# Patient Record
Sex: Female | Born: 1935 | Race: White | Hispanic: No | Smoking: Never smoker
Health system: Southern US, Community
[De-identification: ages and names within clinical notes are randomized; demographics above are authoritative.]

## PROBLEM LIST (undated history)

## (undated) DIAGNOSIS — A0472 Enterocolitis due to Clostridium difficile, not specified as recurrent: Secondary | ICD-10-CM

## (undated) DIAGNOSIS — K625 Hemorrhage of anus and rectum: Secondary | ICD-10-CM

## (undated) DIAGNOSIS — K589 Irritable bowel syndrome without diarrhea: Secondary | ICD-10-CM

## (undated) DIAGNOSIS — E119 Type 2 diabetes mellitus without complications: Secondary | ICD-10-CM

## (undated) DIAGNOSIS — K5909 Other constipation: Secondary | ICD-10-CM

## (undated) DIAGNOSIS — R1032 Left lower quadrant pain: Secondary | ICD-10-CM

## (undated) DIAGNOSIS — K5792 Diverticulitis of intestine, part unspecified, without perforation or abscess without bleeding: Secondary | ICD-10-CM

## (undated) DIAGNOSIS — N289 Disorder of kidney and ureter, unspecified: Secondary | ICD-10-CM

## (undated) DIAGNOSIS — A498 Other bacterial infections of unspecified site: Secondary | ICD-10-CM

## (undated) DIAGNOSIS — K529 Noninfective gastroenteritis and colitis, unspecified: Secondary | ICD-10-CM

## (undated) DIAGNOSIS — I10 Essential (primary) hypertension: Secondary | ICD-10-CM

## (undated) HISTORY — PX: KIDNEY STONE SURGERY: SHX686

## (undated) HISTORY — DX: Diverticulitis of intestine, part unspecified, without perforation or abscess without bleeding: K57.92

## (undated) HISTORY — PX: OTHER SURGICAL HISTORY: SHX169

## (undated) HISTORY — DX: Other constipation: K59.09

## (undated) HISTORY — DX: Other bacterial infections of unspecified site: A49.8

## (undated) HISTORY — PX: BURN TREATMENT: SHX158

## (undated) HISTORY — DX: Irritable bowel syndrome, unspecified: K58.9

## (undated) HISTORY — DX: Type 2 diabetes mellitus without complications: E11.9

## (undated) HISTORY — DX: Essential (primary) hypertension: I10

## (undated) HISTORY — PX: BREAST BIOPSY: SHX20

## (undated) HISTORY — DX: Left lower quadrant pain: R10.32

## (undated) HISTORY — DX: Hemorrhage of anus and rectum: K62.5

## (undated) HISTORY — DX: Noninfective gastroenteritis and colitis, unspecified: K52.9

## (undated) HISTORY — PX: TONSILLECTOMY: SUR1361

## (undated) HISTORY — DX: Enterocolitis due to Clostridium difficile, not specified as recurrent: A04.72

---

## 1995-05-24 HISTORY — PX: SIGMOIDOSCOPY: SUR1295

## 1998-12-20 ENCOUNTER — Ambulatory Visit (HOSPITAL_BASED_OUTPATIENT_CLINIC_OR_DEPARTMENT_OTHER): Admission: RE | Admit: 1998-12-20 | Discharge: 1998-12-20 | Payer: Self-pay | Admitting: Orthopedic Surgery

## 2000-09-30 ENCOUNTER — Ambulatory Visit (HOSPITAL_COMMUNITY): Admission: RE | Admit: 2000-09-30 | Discharge: 2000-09-30 | Payer: Self-pay | Admitting: Obstetrics and Gynecology

## 2000-09-30 ENCOUNTER — Encounter: Payer: Self-pay | Admitting: Obstetrics and Gynecology

## 2000-12-10 ENCOUNTER — Ambulatory Visit (HOSPITAL_COMMUNITY): Admission: RE | Admit: 2000-12-10 | Discharge: 2000-12-10 | Payer: Self-pay | Admitting: Pulmonary Disease

## 2001-08-03 ENCOUNTER — Other Ambulatory Visit: Admission: RE | Admit: 2001-08-03 | Discharge: 2001-08-03 | Payer: Self-pay | Admitting: Obstetrics and Gynecology

## 2002-12-15 ENCOUNTER — Ambulatory Visit (HOSPITAL_COMMUNITY): Admission: RE | Admit: 2002-12-15 | Discharge: 2002-12-15 | Payer: Self-pay | Admitting: Obstetrics and Gynecology

## 2002-12-15 ENCOUNTER — Encounter: Payer: Self-pay | Admitting: Obstetrics and Gynecology

## 2003-01-25 ENCOUNTER — Other Ambulatory Visit: Admission: RE | Admit: 2003-01-25 | Discharge: 2003-01-25 | Payer: Self-pay | Admitting: Dermatology

## 2004-02-01 ENCOUNTER — Ambulatory Visit (HOSPITAL_COMMUNITY): Admission: RE | Admit: 2004-02-01 | Discharge: 2004-02-01 | Payer: Self-pay | Admitting: Obstetrics and Gynecology

## 2004-05-14 ENCOUNTER — Ambulatory Visit: Payer: Self-pay | Admitting: Family Medicine

## 2004-11-11 ENCOUNTER — Ambulatory Visit: Payer: Self-pay | Admitting: Family Medicine

## 2004-12-17 ENCOUNTER — Ambulatory Visit: Payer: Self-pay | Admitting: Family Medicine

## 2005-02-04 ENCOUNTER — Ambulatory Visit: Payer: Self-pay | Admitting: Family Medicine

## 2005-03-03 ENCOUNTER — Ambulatory Visit (HOSPITAL_COMMUNITY): Admission: RE | Admit: 2005-03-03 | Discharge: 2005-03-03 | Payer: Self-pay | Admitting: Obstetrics and Gynecology

## 2005-03-16 ENCOUNTER — Ambulatory Visit: Payer: Self-pay | Admitting: Family Medicine

## 2005-04-02 ENCOUNTER — Ambulatory Visit: Payer: Self-pay | Admitting: Family Medicine

## 2005-05-11 ENCOUNTER — Ambulatory Visit: Payer: Self-pay | Admitting: Family Medicine

## 2005-08-13 ENCOUNTER — Encounter (INDEPENDENT_AMBULATORY_CARE_PROVIDER_SITE_OTHER): Payer: Self-pay | Admitting: Specialist

## 2005-08-13 ENCOUNTER — Ambulatory Visit (HOSPITAL_BASED_OUTPATIENT_CLINIC_OR_DEPARTMENT_OTHER): Admission: RE | Admit: 2005-08-13 | Discharge: 2005-08-13 | Payer: Self-pay | Admitting: Orthopedic Surgery

## 2005-08-18 ENCOUNTER — Ambulatory Visit: Payer: Self-pay | Admitting: Family Medicine

## 2005-09-07 ENCOUNTER — Ambulatory Visit: Payer: Self-pay | Admitting: Family Medicine

## 2005-10-27 ENCOUNTER — Ambulatory Visit: Payer: Self-pay | Admitting: Family Medicine

## 2005-11-30 ENCOUNTER — Ambulatory Visit: Payer: Self-pay | Admitting: Family Medicine

## 2006-03-09 ENCOUNTER — Ambulatory Visit: Payer: Self-pay | Admitting: Family Medicine

## 2006-04-06 ENCOUNTER — Ambulatory Visit (HOSPITAL_COMMUNITY): Admission: RE | Admit: 2006-04-06 | Discharge: 2006-04-06 | Payer: Self-pay | Admitting: Obstetrics and Gynecology

## 2006-04-16 ENCOUNTER — Ambulatory Visit: Payer: Self-pay | Admitting: Family Medicine

## 2006-06-23 ENCOUNTER — Ambulatory Visit: Payer: Self-pay | Admitting: Family Medicine

## 2007-04-11 ENCOUNTER — Ambulatory Visit (HOSPITAL_COMMUNITY): Admission: RE | Admit: 2007-04-11 | Discharge: 2007-04-11 | Payer: Self-pay | Admitting: Obstetrics and Gynecology

## 2007-10-20 ENCOUNTER — Ambulatory Visit (HOSPITAL_COMMUNITY): Admission: RE | Admit: 2007-10-20 | Discharge: 2007-10-20 | Payer: Self-pay | Admitting: Emergency Medicine

## 2007-10-21 ENCOUNTER — Ambulatory Visit (HOSPITAL_COMMUNITY): Admission: RE | Admit: 2007-10-21 | Discharge: 2007-10-21 | Payer: Self-pay | Admitting: Obstetrics & Gynecology

## 2007-10-27 ENCOUNTER — Ambulatory Visit (HOSPITAL_COMMUNITY): Admission: RE | Admit: 2007-10-27 | Discharge: 2007-10-27 | Payer: Self-pay | Admitting: Urology

## 2007-12-06 HISTORY — PX: COLONOSCOPY: SHX174

## 2008-04-25 ENCOUNTER — Ambulatory Visit (HOSPITAL_COMMUNITY): Admission: RE | Admit: 2008-04-25 | Discharge: 2008-04-25 | Payer: Self-pay | Admitting: Obstetrics and Gynecology

## 2009-05-01 ENCOUNTER — Ambulatory Visit (HOSPITAL_COMMUNITY): Admission: RE | Admit: 2009-05-01 | Discharge: 2009-05-01 | Payer: Self-pay | Admitting: Obstetrics and Gynecology

## 2010-05-05 ENCOUNTER — Ambulatory Visit (HOSPITAL_COMMUNITY): Admission: RE | Admit: 2010-05-05 | Discharge: 2010-05-05 | Payer: Self-pay | Admitting: Family Medicine

## 2010-06-17 ENCOUNTER — Ambulatory Visit: Payer: Self-pay | Admitting: Internal Medicine

## 2010-07-18 ENCOUNTER — Emergency Department (HOSPITAL_COMMUNITY)
Admission: EM | Admit: 2010-07-18 | Discharge: 2010-07-18 | Payer: Self-pay | Source: Home / Self Care | Admitting: Emergency Medicine

## 2010-07-18 LAB — CBC
HCT: 39.6 % (ref 36.0–46.0)
MCHC: 34.3 g/dL (ref 30.0–36.0)
RBC: 4.57 MIL/uL (ref 3.87–5.11)
RDW: 14.4 % (ref 11.5–15.5)
WBC: 7.4 10*3/uL (ref 4.0–10.5)

## 2010-07-18 LAB — DIFFERENTIAL
Basophils Relative: 1 % (ref 0–1)
Eosinophils Relative: 2 % (ref 0–5)
Lymphs Abs: 1.3 10*3/uL (ref 0.7–4.0)
Monocytes Absolute: 0.6 10*3/uL (ref 0.1–1.0)
Neutro Abs: 5.2 10*3/uL (ref 1.7–7.7)

## 2010-07-18 LAB — BASIC METABOLIC PANEL
BUN: 17 mg/dL (ref 6–23)
Calcium: 9.9 mg/dL (ref 8.4–10.5)
GFR calc Af Amer: >60 mL/min (ref >60)
GFR calc non Af Amer: 55 mL/min — ABNORMAL LOW (ref >60)
Glucose, Bld: 137 mg/dL — ABNORMAL HIGH (ref 70–99)
Potassium: 3.9 mEq/L (ref 3.5–5.1)

## 2010-07-24 ENCOUNTER — Institutional Professional Consult (permissible substitution) (INDEPENDENT_AMBULATORY_CARE_PROVIDER_SITE_OTHER): Payer: MEDICARE | Admitting: Internal Medicine

## 2010-07-24 ENCOUNTER — Ambulatory Visit: Admit: 2010-07-24 | Payer: Self-pay | Admitting: Internal Medicine

## 2010-07-24 DIAGNOSIS — K5732 Diverticulitis of large intestine without perforation or abscess without bleeding: Secondary | ICD-10-CM

## 2010-09-03 LAB — HEPATIC FUNCTION PANEL: ALT: 13 U/L (ref 7–35)

## 2010-09-03 LAB — BASIC METABOLIC PANEL: Glucose: 170 mg/dL

## 2010-09-03 LAB — CBC AND DIFFERENTIAL: HCT: 39 % (ref 36–46)

## 2010-09-23 ENCOUNTER — Ambulatory Visit (INDEPENDENT_AMBULATORY_CARE_PROVIDER_SITE_OTHER): Payer: MEDICARE | Admitting: Internal Medicine

## 2010-09-23 DIAGNOSIS — Z8719 Personal history of other diseases of the digestive system: Secondary | ICD-10-CM

## 2010-09-23 DIAGNOSIS — K589 Irritable bowel syndrome without diarrhea: Secondary | ICD-10-CM

## 2010-10-06 NOTE — Consult Note (Signed)
NAME:  CROWDERLakitha, Monica Murray             ACCOUNT NO.:  [id number]  MEDICAL RECORD NO.:  [id number]           PATIENT TYPE: AMB.  LOCATION:  [place].                  [facility]  PHYSICIAN:  Monica Murray, M.D.    DATE OF BIRTH:  1935/12/05  DATE OF CONSULTATION:  09/23/2010 DATE OF DISCHARGE:                                OFFICE VISIT.   PRESENTING COMPLAINT:  Followup for recurrent diverticulitis.  The patient complains of irregular bowel movements and urgency.  SUBJECTIVE:  Monica Murray is a 75 year old Caucasian female patient of Dr. Joyce Murray who is well-known to [place].  Her last colonoscopy was in June 2009 which showed scattered diverticula at the sigmoid colon.  She had a small tubular adenoma removed at that time.  In November 2011, she developed sharp severe pain in left lower quadrant of her abdomen.  She was seen by Dr. Lysbeth Murray and treated with Cipro and Flagyl for diverticulitis.  She believes she had a full recovery.  She had recurrence of her pain in January and she was seen at [facility] Emergency Room on July 18, 2010.  She also noticed some blood with a bowel movements.  She had CT which showed changes of sigmoid diverticulitis.  She was retreated with antibiotics and felt better.  At this time, she took Augmentin.  When she came to our office on July 24, 2010, she was on Augmentin.  Few days after stopping Augmentin, her pain relapsed.  She called our office on August 05, 2010.  She was called in Cipro and Flagyl for 10 days.  She states since taking the last course of antibiotic, she has not had any sharp pain.  She had some soreness in left lower quadrant of her abdomen.  She is having diarrhea and/or constipation.  She also has postprandial bowel movement and urgency.  Lately, she has had 4 bowel movements every day and all of these occur before lunch and at times, she will have another one or two later in the day.  Most of her stools are semi-formed.   She has not seen any blood in the last 2 weeks.  She says her appetite is very good and her weight has been stable.  She recalls that her father had a colostomy in his 73s for diverticulitis and subsequently he had it taken down. She wants to make sure that she does not do anything that will cause her to have another episode of diverticulitis.  CURRENT MEDICATIONS: 1. Enalapril/HCT 10/25 daily. 2. ASA 81 mg daily. 3. Fish oil 1 g p.o. daily.  She does not take any OTC and NSAIDs.  OBJECTIVE:  VITAL SIGNS:  Weight 203 pounds, she is 65 inches tall, pulse 70 per minute, blood pressure 110/68 and temp is 97.6. EYES:  Conjunctivae are pink.  Sclerae are nonicteric. MOUTH:  Oropharyngeal mucosa is normal. NECK:  No neck masses or thyromegaly noted. ABDOMEN:  Symmetrical.  Bowel sounds are normal.  On palpation, soft abdomen with mild tenderness at LLQ.  She has incisional hernia and left flank from prior surgery on her left kidney.  No organomegaly or masses noted.  LABORATORY DATA:  From Dr. Joyce Murray office from  September 03, 2010, WBC is 7.4, H and H is 12.7 and 39.2, platelet count 251 K.  Comprehensive chemistry panel is normal exception of glucose of 170, creatinine of 1.24.  Her calcium was 9.4.  TSH was 3.905, sed rate was 36 which is mildly elevated.  ASSESSMENT:  It appears Monica Murray has fully recovered from diverticulitis. She had to be treated with antibiotics on four different occasions.  Her third and fourth episode may just be one extended bout.  Her current symptoms are suggestive of irritable bowel syndrome.  Her CBC is normal. Sed rate is mildly elevated which would be nonspecific, but maybe repeated at some point in the future.  She is up-to-date on her colonoscopies.  She needs to be on high-fiber diet and fiber supplement and she also would benefit from low-dose antispasmodic.  If she has another episode of unequivocal diverticulitis one would have to consider surgery,  but hopefully this would not be necessary.  PLAN:  High-fiber diet plus fiber supplement 3-4 g daily.  Since she has had problems with Metamucil, she can try Benefiber or FiberChoice.  Dicyclomine 10 mg before breakfast.  She may also eat yogurt Activia daily.  Unless she has problems, she will return for OV in 4 months.     Monica Murray, M.D.     NR/MEDQ  D:  09/23/2010  T:  09/24/2010  Job:  [id number]  cc:   Monica Murray, M.D. Fax: [phone]  Electronically Signed by Monica Murray M.D. on 10/06/2010 12:05:00 PM

## 2010-11-07 NOTE — Op Note (Signed)
NAME:  Monica Murray, Monica Murray             ACCOUNT NO.:  [id number]   MEDICAL RECORD NO.:  [id number]          PATIENT TYPE:  AMB   LOCATION:  [facility]                          FACILITY:  [facility]   PHYSICIAN:  Cindee Salt, M.D.       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   OPERATION:  Removal of three cysts, release of A1 pulley right middle  fingers.   SURGEON:  Cindee Salt, M.D.   ASSISTANT:  Carolyne Fiscal R.N.   ANESTHESIA:  General.   HISTORY:  The patient is a 75 year old female with a history of triggering  of her right middle finger. She has developed two cysts on the palmar aspect  of the A1 pulley. She is admitted now for excision and release of the A1  pulley.   PROCEDURE:  The patient is brought to the operating room where a general  anesthetic was carried out without difficulty. She was prepped using  DuraPrep, supine position, right arm free. The limb was exsanguinated with  an Esmarch bandage, tourniquet placed on the forearm was inflated to 250  mmHg, oblique incision was made, carried down through subcutaneous tissue.  This was over the A1 pulley of the right middle finger. Neurovascular  bundles were identified and protected. Retractor was placed.  A large cystic  structure was present over the ulnar aspect of the A1 pulley.  Two other  cysts on the radial side were also noted separated from the other cyst. The  A1 pulley was then release.  The three cysts were excised, sent to  pathology. A small incision was made in the central aspect of the A2 pulley.  The release was performed on the radial aspect of the A1 pulley. The finger  placed through full range motion, no further triggering was  identified.  Palmar pulley was present. This was also released protecting the  neurovascular bundles. The wound was irrigated. Skin closed interrupted 5-0  nylon sutures. Sterile compressive dressing was applied. The patient  tolerated the procedure well was taken to the recovery for observation in  satisfactory condition. She is discharged home to return to the [facility]  of [place] in one week on Vicodin.           ______________________________  Cindee Salt, M.D.     GK/MEDQ  D:  08/13/2005  T:  08/14/2005  Job:  [id number]

## 2011-01-13 ENCOUNTER — Other Ambulatory Visit (INDEPENDENT_AMBULATORY_CARE_PROVIDER_SITE_OTHER): Payer: Self-pay | Admitting: *Deleted

## 2011-01-14 MED ORDER — DICYCLOMINE HCL 10 MG PO CAPS: 10.0000 mg | ORAL_CAPSULE | ORAL | Status: DC

## 2011-01-15 NOTE — Telephone Encounter (Signed)
RX approved.

## 2011-01-26 ENCOUNTER — Encounter (INDEPENDENT_AMBULATORY_CARE_PROVIDER_SITE_OTHER): Payer: Self-pay | Admitting: *Deleted

## 2011-02-05 ENCOUNTER — Encounter (INDEPENDENT_AMBULATORY_CARE_PROVIDER_SITE_OTHER): Payer: Self-pay

## 2011-03-31 ENCOUNTER — Encounter (INDEPENDENT_AMBULATORY_CARE_PROVIDER_SITE_OTHER): Payer: Self-pay | Admitting: Internal Medicine

## 2011-03-31 ENCOUNTER — Ambulatory Visit (INDEPENDENT_AMBULATORY_CARE_PROVIDER_SITE_OTHER): Payer: Medicare Other | Admitting: Internal Medicine

## 2011-03-31 VITALS — BP 130/70 | HR 74 | Temp 97.2°F | Resp 16 | Ht 64.0 in | Wt 209.0 lb

## 2011-03-31 DIAGNOSIS — K589 Irritable bowel syndrome without diarrhea: Secondary | ICD-10-CM

## 2011-03-31 DIAGNOSIS — R109 Unspecified abdominal pain: Secondary | ICD-10-CM

## 2011-03-31 NOTE — Patient Instructions (Addendum)
Increase dicyclomine 20 mg by mouth daily 30 minutes before breakfast. Continue high fiber diet.

## 2011-04-01 ENCOUNTER — Other Ambulatory Visit (HOSPITAL_COMMUNITY): Payer: Self-pay | Admitting: Family Medicine

## 2011-04-01 DIAGNOSIS — Z139 Encounter for screening, unspecified: Secondary | ICD-10-CM

## 2011-04-01 NOTE — Progress Notes (Signed)
Presenting complaint; followup for bowel problems. Subjective; Ghada a 75 year old Caucasian female patient of Dr. Lysbeth Galas for scheduled visit. During the fall of last year and early this year she was treated for recurrent bouts of diverticulitis. She was last seen on 09/23/2010 and felt to have irritable bowel syndrome. She was advice fiber supplement 3-4 g daily and dicyclomine 10 mg every morning and yogurt daily. She states she is not having any abdominal pain but her bowels are still not regular. Every morning she has 3 bowel movements in each one of them is small volume. Stool is generally formed. She denies melena or rectal bleeding. She has not been constipated since her last visit. Her appetite is good. She has gained 6 pounds since her last visit. She reports no side effects or dicyclomine. Current medications Current Outpatient Prescriptions  Medication Sig Dispense Refill  . aspirin 81 MG tablet Take 81 mg by mouth daily.        Marland Kitchen dicyclomine (BENTYL) 10 MG capsule Take 1 capsule (10 mg total) by mouth every morning.  30 capsule  1  . enalapril-hydrochlorothiazide (VASERETIC) 10-25 MG per tablet Take 1 tablet by mouth daily. As Needed      . fish oil-omega-3 fatty acids 1000 MG capsule Take by mouth daily.        . IBUPROFEN PO Take 200 mg by mouth. As Needed         Objective BP 130/70  Pulse 74  Temp(Src) 97.2 F (36.2 C) (Oral)  Resp 16  Ht 5\' 4"  (1.626 m)  Wt 209 lb (94.802 kg)  BMI 35.87 kg/m2 Conjunctiva is pink. Sclerae nonicteric Oropharyngeal mucosa is normal. No neck masses or thyromegaly noted. Her abdomen is full. Bowel sounds are hyperactive. Abdomen is soft and nontender without organomegaly or masses. No peripheral edema noted. Assessment Patient's symptoms are felt to be typical of irritable bowel syndrome. She may benefit from high dose of dicyclomine. She now has gone about 8 or 9 months without diverticulitis which is very reassuring. Plan Continue  high-fiber diet. Increase dicyclomine to 20 mg daily before breakfast. If this therapy does not work she'll give [place] a call otherwise return for office visit in one year.

## 2011-04-17 ENCOUNTER — Other Ambulatory Visit (INDEPENDENT_AMBULATORY_CARE_PROVIDER_SITE_OTHER): Payer: Self-pay | Admitting: Internal Medicine

## 2011-04-17 DIAGNOSIS — K5792 Diverticulitis of intestine, part unspecified, without perforation or abscess without bleeding: Secondary | ICD-10-CM

## 2011-05-07 ENCOUNTER — Ambulatory Visit (HOSPITAL_COMMUNITY)
Admission: RE | Admit: 2011-05-07 | Discharge: 2011-05-07 | Disposition: A | Discharge status: Home / Self Care | Payer: 59 | Source: Ambulatory Visit | Attending: Family Medicine | Admitting: Family Medicine

## 2011-05-07 DIAGNOSIS — Z1231 Encounter for screening mammogram for malignant neoplasm of breast: Secondary | ICD-10-CM | POA: Insufficient documentation

## 2011-05-07 DIAGNOSIS — Z139 Encounter for screening, unspecified: Secondary | ICD-10-CM

## 2011-06-05 ENCOUNTER — Other Ambulatory Visit: Payer: Self-pay | Admitting: Family Medicine

## 2011-06-05 DIAGNOSIS — R928 Other abnormal and inconclusive findings on diagnostic imaging of breast: Secondary | ICD-10-CM

## 2011-06-17 ENCOUNTER — Ambulatory Visit (HOSPITAL_COMMUNITY)
Admission: RE | Admit: 2011-06-17 | Discharge: 2011-06-17 | Disposition: A | Discharge status: Home / Self Care | Payer: 59 | Source: Ambulatory Visit | Attending: Family Medicine | Admitting: Family Medicine

## 2011-06-17 DIAGNOSIS — R928 Other abnormal and inconclusive findings on diagnostic imaging of breast: Secondary | ICD-10-CM

## 2012-03-30 ENCOUNTER — Encounter (INDEPENDENT_AMBULATORY_CARE_PROVIDER_SITE_OTHER): Payer: Self-pay | Admitting: *Deleted

## 2012-04-13 ENCOUNTER — Ambulatory Visit (INDEPENDENT_AMBULATORY_CARE_PROVIDER_SITE_OTHER): Payer: Medicare Other | Admitting: Internal Medicine

## 2012-04-13 ENCOUNTER — Encounter (INDEPENDENT_AMBULATORY_CARE_PROVIDER_SITE_OTHER): Payer: Self-pay | Admitting: Internal Medicine

## 2012-04-13 VITALS — BP 116/68 | HR 64 | Temp 97.8°F | Ht 64.0 in | Wt 205.2 lb

## 2012-04-13 DIAGNOSIS — K589 Irritable bowel syndrome without diarrhea: Secondary | ICD-10-CM

## 2012-04-13 MED ORDER — DICYCLOMINE HCL 10 MG PO CAPS: 10.0000 mg | ORAL_CAPSULE | Freq: Three times a day (TID) | ORAL | Status: DC

## 2012-04-13 NOTE — Progress Notes (Signed)
Subjective:     Patient ID: Monica Murray, female   DOB: Jul 04, 1935, 76 y.o.   MRN: [id number]  HPIHere today for a scheduled visit. She tells me she is having 4 loose stools a day. She thinks she may see mucous. She says her rectum is tender and has been using clobetasol propionate.  After she has diarrhea she will have rectal burning.   Diarrhea about once a week.  She has been incontinent. Appetite is good. NO weight loss.  No melena or bright red rectal bleeding.  No recent antibiotics. No recent bout of diverticulitis      03/31/2011 Subjective; Monica Murray a 76 year old Caucasian female patient of Dr. Lysbeth Galas for scheduled visit. During the fall of last year and early this year she was treated for recurrent bouts of diverticulitis. She was last seen on 09/23/2010 and felt to have irritable bowel syndrome. She was advice fiber supplement 3-4 g daily and dicyclomine 10 mg every morning and yogurt daily.  She states she is not having any abdominal pain but her bowels are still not regular. Every morning she has 3 bowel movements in each one of them is small volume. Stool is generally formed. She denies melena or rectal bleeding. She has not been constipated since her last visit. Her appetite is good. She has gained 6 pounds since her last visit. She reports no side effects or dicyclomine.  Review of Systems  Current Outpatient Prescriptions  Medication Sig Dispense Refill  . aspirin 81 MG tablet Take 81 mg by mouth daily.        . enalapril-hydrochlorothiazide (VASERETIC) 10-25 MG per tablet Take 1 tablet by mouth daily. As Needed      . escitalopram (LEXAPRO) 10 MG tablet Take 20 mg by mouth daily.      . fish oil-omega-3 fatty acids 1000 MG capsule Take by mouth daily.        . Red Yeast Rice Extract (RED YEAST RICE PO) Take by mouth. 4 tabs a day      . DISCONTD: dicyclomine (BENTYL) 10 MG capsule Take 10 mg by mouth 2 (two) times daily before a meal.      . dicyclomine (BENTYL) 10 MG  capsule Take 1 capsule (10 mg total) by mouth 3 (three) times daily.  90 capsule  4  . DISCONTD: dicyclomine (BENTYL) 10 MG capsule Take 1 capsule (10 mg total) by mouth every morning.  30 capsule  1   Past Medical History  Diagnosis Date  . Diverticulitis   . LLQ pain   . Chronic constipation   . Chronic diarrhea   . Irritable bowel syndrome   . Rectal bleed    Past Surgical History  Procedure Date  . Colonoscopy 12/06/07    NUR  . Sigmoidoscopy 05/24/95    NUR   History   Social History  . Marital Status: Married    Spouse Name: N/A    Number of Children: N/A  . Years of Education: N/A   Occupational History  . Not on file.   Social History Main Topics  . Smoking status: Never Smoker   . Smokeless tobacco: Never Used  . Alcohol Use: No  . Drug Use: No  . Sexually Active: Not on file   Other Topics Concern  . Not on file   Social History Narrative  . No narrative on file   Family Status  Relation Status Death Age  . Mother Deceased COPD    Passed away in 12/03/1999  .  Father Deceased 76    Died 13  . Sister Alive   . Son Alive     Cancer on Penis removed 2 years ago   Allergies  Allergen Reactions  . Macrodantin   . Sulfa Antibiotics        Objective:   Physical Exam Filed Vitals:   04/13/12 1037  BP: 116/68  Pulse: 64  Temp: 97.8 F (36.6 C)  Height: 5\' 4"  (1.626 m)  Weight: 205 lb 3.2 oz (93.078 kg)   Alert and oriented. Skin warm and dry. Oral mucosa is moist.   . Sclera anicteric, conjunctivae is pink. Thyroid not enlarged. No cervical lymphadenopathy. Lungs clear. Heart regular rate and rhythm.  Abdomen is soft. Bowel sounds are positive. No hepatomegaly. No abdominal masses felt. No tenderness.  No edema to lower extremities. Patient is alert and oriented.     Assessment:   Probable IBS diarrhea predominant.  She has hx of same. No recent antibiotics. She has had some fecal incontinence. Will get stool studies to be sure we are not dealing  with an infectious process.    Plan:    Fiber tabs.  Imodium BID on days you have diarrhea. Stool for c-diff, o and P, culture, WBC. Dicyclomine 10mg  TID.  OV in 1 yr

## 2012-04-13 NOTE — Patient Instructions (Signed)
Fiber tabs 4 gm. Imodium BID. Stool studies. Increase Dicyclomine to TID

## 2012-04-13 NOTE — Addendum Note (Signed)
Addended by: Len Blalock on: 04/13/2012 11:05 AM   Modules accepted: Orders

## 2012-04-15 LAB — FECAL LACTOFERRIN, QUANT: Lactoferrin: POSITIVE

## 2012-04-15 LAB — OVA AND PARASITE SCREEN: OP: NONE SEEN

## 2012-04-18 LAB — STOOL CULTURE

## 2012-05-09 ENCOUNTER — Encounter (INDEPENDENT_AMBULATORY_CARE_PROVIDER_SITE_OTHER): Payer: Self-pay

## 2012-05-31 ENCOUNTER — Other Ambulatory Visit: Payer: Self-pay | Admitting: Obstetrics & Gynecology

## 2012-05-31 DIAGNOSIS — Z139 Encounter for screening, unspecified: Secondary | ICD-10-CM

## 2012-06-09 ENCOUNTER — Ambulatory Visit (HOSPITAL_COMMUNITY): Payer: Medicare Other

## 2012-06-20 ENCOUNTER — Ambulatory Visit (HOSPITAL_COMMUNITY)
Admission: RE | Admit: 2012-06-20 | Discharge: 2012-06-20 | Disposition: A | Discharge status: Home / Self Care | Payer: 59 | Source: Ambulatory Visit | Attending: Obstetrics & Gynecology | Admitting: Obstetrics & Gynecology

## 2012-06-20 DIAGNOSIS — Z1231 Encounter for screening mammogram for malignant neoplasm of breast: Secondary | ICD-10-CM | POA: Insufficient documentation

## 2012-06-20 DIAGNOSIS — Z139 Encounter for screening, unspecified: Secondary | ICD-10-CM

## 2012-09-19 DIAGNOSIS — T3121 Burns involving 20-29% of body surface with 10-19% third degree burns: Secondary | ICD-10-CM | POA: Insufficient documentation

## 2013-04-04 ENCOUNTER — Encounter (INDEPENDENT_AMBULATORY_CARE_PROVIDER_SITE_OTHER): Payer: Self-pay | Admitting: *Deleted

## 2013-05-29 ENCOUNTER — Ambulatory Visit (INDEPENDENT_AMBULATORY_CARE_PROVIDER_SITE_OTHER): Payer: 59 | Admitting: Internal Medicine

## 2013-06-27 ENCOUNTER — Other Ambulatory Visit: Payer: Self-pay | Admitting: Obstetrics & Gynecology

## 2013-06-27 DIAGNOSIS — Z139 Encounter for screening, unspecified: Secondary | ICD-10-CM

## 2013-07-10 ENCOUNTER — Ambulatory Visit (HOSPITAL_COMMUNITY): Payer: 59

## 2013-07-11 ENCOUNTER — Ambulatory Visit: Payer: Self-pay | Admitting: Obstetrics & Gynecology

## 2013-08-17 ENCOUNTER — Ambulatory Visit (HOSPITAL_COMMUNITY): Payer: 59

## 2013-08-17 ENCOUNTER — Other Ambulatory Visit: Payer: Self-pay | Admitting: Obstetrics & Gynecology

## 2013-08-22 ENCOUNTER — Ambulatory Visit (HOSPITAL_COMMUNITY)
Admission: RE | Admit: 2013-08-22 | Discharge: 2013-08-22 | Disposition: A | Discharge status: Home / Self Care | Payer: Medicare Other | Source: Ambulatory Visit | Attending: Obstetrics & Gynecology | Admitting: Obstetrics & Gynecology

## 2013-08-22 DIAGNOSIS — Z139 Encounter for screening, unspecified: Secondary | ICD-10-CM

## 2013-08-22 DIAGNOSIS — Z1231 Encounter for screening mammogram for malignant neoplasm of breast: Secondary | ICD-10-CM | POA: Insufficient documentation

## 2013-08-29 ENCOUNTER — Encounter (INDEPENDENT_AMBULATORY_CARE_PROVIDER_SITE_OTHER): Payer: Self-pay

## 2013-08-29 ENCOUNTER — Ambulatory Visit (INDEPENDENT_AMBULATORY_CARE_PROVIDER_SITE_OTHER): Payer: Medicare Other | Admitting: Obstetrics & Gynecology

## 2013-08-29 ENCOUNTER — Encounter: Payer: Self-pay | Admitting: Obstetrics & Gynecology

## 2013-08-29 VITALS — BP 140/80 | Ht 63.0 in | Wt 208.0 lb

## 2013-08-29 DIAGNOSIS — Z01419 Encounter for gynecological examination (general) (routine) without abnormal findings: Secondary | ICD-10-CM

## 2013-08-29 DIAGNOSIS — N3941 Urge incontinence: Secondary | ICD-10-CM | POA: Insufficient documentation

## 2013-08-29 MED ORDER — MIRABEGRON ER 50 MG PO TB24: ORAL_TABLET | ORAL | Status: DC

## 2013-08-29 NOTE — Progress Notes (Signed)
Patient ID: Monica Murray, female   DOB: [birth year], 78 y.o.   MRN: [id number] Subjective:     GLENNYS Murray is a 78 y.o. female here for a routine exam.  No LMP recorded. Patient has had a hysterectomy. No obstetric history on file. Birth Control Method:  na Menstrual Calendar(currently): na  Current complaints: none.   Current acute medical issues:  na   Recent Gynecologic History No LMP recorded. Patient has had a hysterectomy. Last Pap: na,   Last mammogram: 2015,  normal  Past Medical History  Diagnosis Date  . Diverticulitis   . LLQ pain   . Chronic constipation   . Chronic diarrhea   . Irritable bowel syndrome   . Rectal bleed     Past Surgical History  Procedure Laterality Date  . Colonoscopy  12/06/07    NUR  . Sigmoidoscopy  05/24/95    NUR    OB History   Grav Para Term Preterm Abortions TAB SAB Ect Mult Living                  History   Social History  . Marital Status: Married    Spouse Name: N/A    Number of Children: N/A  . Years of Education: N/A   Social History Main Topics  . Smoking status: Never Smoker   . Smokeless tobacco: Never Used  . Alcohol Use: No  . Drug Use: No  . Sexual Activity: None   Other Topics Concern  . None   Social History Narrative  . None    Family History  Problem Relation Age of Onset  . Breast cancer Sister      Review of Systems  Review of Systems  Constitutional: Negative for fever, chills, weight loss, malaise/fatigue and diaphoresis.  HENT: Negative for hearing loss, ear pain, nosebleeds, congestion, sore throat, neck pain, tinnitus and ear discharge.   Eyes: Negative for blurred vision, double vision, photophobia, pain, discharge and redness.  Respiratory: Negative for cough, hemoptysis, sputum production, shortness of breath, wheezing and stridor.   Cardiovascular: Negative for chest pain, palpitations, orthopnea, claudication, leg swelling and PND.  Gastrointestinal: negative for  abdominal pain. Negative for heartburn, nausea, vomiting, diarrhea, constipation, blood in stool and melena.  Genitourinary: Negative for dysuria, urgency, frequency, hematuria and flank pain.  Musculoskeletal: Negative for myalgias, back pain, joint pain and falls.  Skin: Negative for itching and rash.  Neurological: Negative for dizziness, tingling, tremors, sensory change, speech change, focal weakness, seizures, loss of consciousness, weakness and headaches.  Endo/Heme/Allergies: Negative for environmental allergies and polydipsia. Does not bruise/bleed easily.  Psychiatric/Behavioral: Negative for depression, suicidal ideas, hallucinations, memory loss and substance abuse. The patient is not nervous/anxious and does not have insomnia.        Objective:    Physical Exam  Vitals reviewed. Constitutional: She is oriented to person, place, and time. She appears well-developed and well-nourished.  HENT:  Head: Normocephalic and atraumatic.        Right Ear: External ear normal.  Left Ear: External ear normal.  Nose: Nose normal.  Mouth/Throat: Oropharynx is clear and moist.  Eyes: Conjunctivae and EOM are normal. Pupils are equal, round, and reactive to light. Right eye exhibits no discharge. Left eye exhibits no discharge. No scleral icterus.  Neck: Normal range of motion. Neck supple. No tracheal deviation present. No thyromegaly present.  Cardiovascular: Normal rate, regular rhythm, normal heart sounds and intact distal pulses.  Exam reveals no gallop and no  friction rub.   No murmur heard. Respiratory: Effort normal and breath sounds normal. No respiratory distress. She has no wheezes. She has no rales. She exhibits no tenderness.  GI: Soft. Bowel sounds are normal. She exhibits no distension and no mass. There is no tenderness. There is no rebound and no guarding.  Genitourinary:  Breasts no masses skin changes or nipple changes bilaterally      Vulva is normal without  lesions Vagina is pink moist without discharge Cervix absent Uterus is absent Adnexa is negative Rectal   Declined by pt  Musculoskeletal: Normal range of motion. She exhibits no edema and no tenderness.  Neurological: She is alert and oriented to person, place, and time. She has normal reflexes. She displays normal reflexes. No cranial nerve deficit. She exhibits normal muscle tone. Coordination normal.  Skin: Skin is warm and dry. No rash noted. No erythema. No pallor.  Psychiatric: She has a normal mood and affect. Her behavior is normal. Judgment and thought content normal.       Assessment:    Urge incontinence  Plan:    Follow up in: 2 years.   Mybetriq 50 mg qhs

## 2013-09-21 ENCOUNTER — Other Ambulatory Visit: Payer: Self-pay | Admitting: Obstetrics & Gynecology

## 2014-05-17 ENCOUNTER — Emergency Department (HOSPITAL_COMMUNITY)
Admission: EM | Admit: 2014-05-17 | Discharge: 2014-05-17 | Disposition: A | Discharge status: Home / Self Care | Payer: Medicare Other | Attending: Emergency Medicine | Admitting: Emergency Medicine

## 2014-05-17 ENCOUNTER — Encounter (HOSPITAL_COMMUNITY): Payer: Self-pay

## 2014-05-17 DIAGNOSIS — R319 Hematuria, unspecified: Secondary | ICD-10-CM | POA: Diagnosis present

## 2014-05-17 DIAGNOSIS — N390 Urinary tract infection, site not specified: Secondary | ICD-10-CM | POA: Diagnosis not present

## 2014-05-17 DIAGNOSIS — Z79899 Other long term (current) drug therapy: Secondary | ICD-10-CM | POA: Insufficient documentation

## 2014-05-17 DIAGNOSIS — Z8719 Personal history of other diseases of the digestive system: Secondary | ICD-10-CM | POA: Diagnosis not present

## 2014-05-17 DIAGNOSIS — Z7982 Long term (current) use of aspirin: Secondary | ICD-10-CM | POA: Insufficient documentation

## 2014-05-17 HISTORY — DX: Disorder of kidney and ureter, unspecified: N28.9

## 2014-05-17 LAB — URINALYSIS, ROUTINE W REFLEX MICROSCOPIC
Bilirubin Urine: NEGATIVE
GLUCOSE, UA: NEGATIVE mg/dL
Ketones, ur: NEGATIVE mg/dL
Nitrite: POSITIVE — AB
PH: 6.5 (ref 5.0–8.0)
Protein, ur: >300 mg/dL — AB
Specific Gravity, Urine: 1.02 (ref 1.005–1.030)
Urobilinogen, UA: 0.2 mg/dL (ref 0.0–1.0)

## 2014-05-17 LAB — URINE MICROSCOPIC-ADD ON

## 2014-05-17 MED ORDER — CEFUROXIME AXETIL 250 MG PO TABS: 250.0000 mg | ORAL_TABLET | Freq: Two times a day (BID) | ORAL | Status: DC

## 2014-05-17 MED ORDER — LIDOCAINE HCL (PF) 1 % IJ SOLN
INTRAMUSCULAR | Status: AC
  Administered 2014-05-17: 09:00:00
  Filled 2014-05-17: qty 5

## 2014-05-17 MED ORDER — CEFTRIAXONE SODIUM 1 G IJ SOLR
1.0000 g | Freq: Once | INTRAMUSCULAR | Status: AC
  Administered 2014-05-17: 1 g via INTRAMUSCULAR
  Filled 2014-05-17: qty 10

## 2014-05-17 MED ORDER — PHENAZOPYRIDINE HCL 100 MG PO TABS
200.0000 mg | ORAL_TABLET | Freq: Once | ORAL | Status: AC
  Administered 2014-05-17: 200 mg via ORAL
  Filled 2014-05-17: qty 2

## 2014-05-17 NOTE — ED Notes (Signed)
Pt c/o sinus infection x 3 weeks and was put on amoxicillin.  Pt says never really got over it.  This past week c/o feeling "terrible."  Reports frequent urination since yesterday.  Last night around 2215 had severe lower abd pain and urge to void.  Reports urinated and saw blood in urine.  Reports voiding every hour since then.

## 2014-05-17 NOTE — ED Provider Notes (Signed)
CSN: [id number]     Arrival date & time 05/17/14  [id number] History  This chart was scribed for Gilda Creasehristopher J. Daelin Haste, * by Richarda Overlieichard Holland, ED Scribe. This patient was seen in room APA04/APA04 and the patient's care was started 8:03 AM.    Chief Complaint  Patient presents with  . Hematuria   The history is provided by the patient. No language interpreter was used.   HPI Comments: Monica Murray is a 78 y.o. female who presents to the Emergency Department complaining of hematuria that started last night. Pt reports associated lower abdominal pain. Pt reports she had a sinus infection with productive cough and green phlegm for the last 3 weeks and was treated with amoxacillin by her PCP. She states she has felt terrible this last week and never got over her symptoms. She says she had increased urinary frequency that started yesterday and worsened last night. She says she has had UTIs and kidney stones in the past. She states her hematuria started last night and was unsure if she had a kidney stone or UTI. She reports she is allergic to bactrim, macrodantin and sulfa antibiotics.   PCP Dr. Lysbeth GalasNyland   Past Medical History  Diagnosis Date  . Diverticulitis   . LLQ pain   . Chronic constipation   . Chronic diarrhea   . Irritable bowel syndrome   . Rectal bleed    Past Surgical History  Procedure Laterality Date  . Colonoscopy  12/06/07    NUR  . Sigmoidoscopy  05/24/95    NUR   Family History  Problem Relation Age of Onset  . Breast cancer Sister    History  Substance Use Topics  . Smoking status: Never Smoker   . Smokeless tobacco: Never Used  . Alcohol Use: No   OB History    No data available     Review of Systems  Gastrointestinal: Positive for abdominal pain.  Genitourinary: Positive for frequency and hematuria.  All other systems reviewed and are negative.   Allergies  Macrodantin and Sulfa antibiotics  Home Medications   Prior to Admission medications    Medication Sig Start Date End Date Taking? Authorizing Provider  aspirin 81 MG tablet Take 81 mg by mouth daily.      Historical Provider, MD  clobetasol cream (TEMOVATE) 0.05 % Apply 1 application topically 2 (two) times daily.    Historical Provider, MD  dicyclomine (BENTYL) 10 MG capsule Take 1 capsule (10 mg total) by mouth 3 (three) times daily. 04/13/12   Len Blalockerri L Setzer, NP  enalapril-hydrochlorothiazide (VASERETIC) 10-25 MG per tablet Take 1 tablet by mouth daily. As Needed    Historical Provider, MD  escitalopram (LEXAPRO) 10 MG tablet Take 20 mg by mouth daily.    Historical Provider, MD  fish oil-omega-3 fatty acids 1000 MG capsule Take by mouth daily.      Historical Provider, MD  furosemide (LASIX) 40 MG tablet Take 40 mg by mouth.    Historical Provider, MD  loperamide (IMODIUM) 2 MG capsule Take by mouth as needed for diarrhea or loose stools.    Historical Provider, MD  metoprolol tartrate (LOPRESSOR) 25 MG tablet Take 25 mg by mouth 2 (two) times daily.    Historical Provider, MD  MYRBETRIQ 50 MG TB24 tablet TAKE ONE TABLET AT BEDTIME    Lazaro ArmsLuther H Eure, MD  pravastatin (PRAVACHOL) 40 MG tablet Take 40 mg by mouth daily.    Historical Provider, MD  Red Yeast Rice Extract (  RED YEAST RICE PO) Take by mouth. 4 tabs a day    Historical Provider, MD   BP 160/74 mmHg  Pulse 82  Temp(Src) 98.3 F (36.8 C) (Oral)  Resp 18  Ht 5\' 4"  (1.626 m)  Wt 200 lb (90.719 kg)  BMI 34.31 kg/m2  SpO2 94%   Physical Exam  Constitutional: She is oriented to person, place, and time. She appears well-developed and well-nourished. No distress.  HENT:  Head: Normocephalic and atraumatic.  Right Ear: Hearing normal.  Left Ear: Hearing normal.  Nose: Nose normal.  Mouth/Throat: Oropharynx is clear and moist and mucous membranes are normal.  Eyes: Conjunctivae and EOM are normal. Pupils are equal, round, and reactive to light.  Neck: Normal range of motion. Neck supple.  Cardiovascular: Regular  rhythm, S1 normal and S2 normal.  Exam reveals no gallop and no friction rub.   No murmur heard. Pulmonary/Chest: Effort normal and breath sounds normal. No respiratory distress. She has no wheezes. She has no rales. She exhibits no tenderness.  Abdominal: Soft. Normal appearance and bowel sounds are normal. There is no hepatosplenomegaly. There is tenderness. There is no rebound, no guarding, no tenderness at McBurney's point and negative Murphy's sign. No hernia.  Suprapubic tenderness.   Musculoskeletal: Normal range of motion.  Neurological: She is alert and oriented to person, place, and time. She has normal strength. No cranial nerve deficit or sensory deficit. Coordination normal. GCS eye subscore is 4. GCS verbal subscore is 5. GCS motor subscore is 6.  Skin: Skin is warm, dry and intact. No rash noted. No cyanosis.  Psychiatric: She has a normal mood and affect. Her speech is normal and behavior is normal. Thought content normal.  Nursing note and vitals reviewed.   ED Course  Procedures  DIAGNOSTIC STUDIES: Oxygen Saturation is 94% on RA, normal by my interpretation.    COORDINATION OF CARE: 8:08 AM Discussed treatment plan with pt at bedside and pt agreed to plan.   Labs Review Labs Reviewed  URINALYSIS, ROUTINE W REFLEX MICROSCOPIC - Abnormal; Notable for the following:    APPearance TURBID (*)    Hgb urine dipstick LARGE (*)    Protein, ur >300 (*)    Nitrite POSITIVE (*)    Leukocytes, UA LARGE (*)    All other components within normal limits  URINE MICROSCOPIC-ADD ON - Abnormal; Notable for the following:    Bacteria, UA MANY (*)    All other components within normal limits    Imaging Review No results found.   EKG Interpretation None      MDM   Final diagnoses:  None   UTI  Patient presents to the ER for evaluation of urinary frequency, urinary urgency, dysuria, hematuria. She does have a history of recurrent urinary tract infections. Patient does not  have a fever or any systemic illness. She appears well. She is tolerating by mouth. Urinalysis confirms infection. She was treated with Rocephin, will continue Ceftin as an outpatient. Return if symptoms worsen.  I personally performed the services described in this documentation, which was scribed in my presence. The recorded information has been reviewed and is accurate.      Gilda Creasehristopher J. Ikia Cincotta, MD 05/17/14 [id number]

## 2014-05-17 NOTE — Discharge Instructions (Signed)

## 2014-05-19 LAB — URINE CULTURE

## 2014-05-21 ENCOUNTER — Telehealth (HOSPITAL_COMMUNITY): Payer: Self-pay

## 2014-05-21 NOTE — Telephone Encounter (Signed)
Post ED Visit - Positive Culture Follow-up  Culture report reviewed by antimicrobial stewardship pharmacist: []  Wes Dulaney, Pharm.D., BCPS []  Celedonio MiyamotoJeremy Frens, Pharm.D., BCPS []  Georgina PillionElizabeth Martin, Pharm.D., BCPS [x]  EnumclawMinh Pham, [address]Ph[address]D., BCPS, AAHIVP []  Estella HuskMichelle Turner, Pharm.D., BCPS, AAHIVP []  Babs BertinHaley Baird, [address]Pharm[address]D.   Positive Urine culture Treated with Cefuroxime, organism sensitive to the same and no further patient follow-up is required at this time.  Arvid RightClark, Annjanette Wertenberger Dorn 05/21/2014, 6:40 AM

## 2014-06-12 ENCOUNTER — Other Ambulatory Visit (HOSPITAL_COMMUNITY): Payer: Self-pay | Admitting: Family Medicine

## 2014-06-12 DIAGNOSIS — K5792 Diverticulitis of intestine, part unspecified, without perforation or abscess without bleeding: Secondary | ICD-10-CM

## 2014-06-13 ENCOUNTER — Ambulatory Visit (HOSPITAL_COMMUNITY)
Admission: RE | Admit: 2014-06-13 | Discharge: 2014-06-13 | Disposition: A | Discharge status: Home / Self Care | Payer: Medicare Other | Source: Ambulatory Visit | Attending: Family Medicine | Admitting: Family Medicine

## 2014-06-13 DIAGNOSIS — R1030 Lower abdominal pain, unspecified: Secondary | ICD-10-CM | POA: Diagnosis present

## 2014-06-13 DIAGNOSIS — K5732 Diverticulitis of large intestine without perforation or abscess without bleeding: Secondary | ICD-10-CM | POA: Insufficient documentation

## 2014-06-13 DIAGNOSIS — K5792 Diverticulitis of intestine, part unspecified, without perforation or abscess without bleeding: Secondary | ICD-10-CM

## 2014-06-13 DIAGNOSIS — K458 Other specified abdominal hernia without obstruction or gangrene: Secondary | ICD-10-CM | POA: Diagnosis not present

## 2014-06-13 DIAGNOSIS — N1330 Unspecified hydronephrosis: Secondary | ICD-10-CM | POA: Diagnosis not present

## 2014-06-13 LAB — POCT I-STAT CREATININE: Creatinine, Ser: 1 mg/dL (ref 0.50–1.10)

## 2014-06-13 MED ORDER — IOHEXOL 300 MG/ML  SOLN
100.0000 mL | Freq: Once | INTRAMUSCULAR | Status: AC | PRN
  Administered 2014-06-13: 100 mL via INTRAVENOUS

## 2014-07-26 ENCOUNTER — Encounter (INDEPENDENT_AMBULATORY_CARE_PROVIDER_SITE_OTHER): Payer: Self-pay | Admitting: *Deleted

## 2014-08-02 ENCOUNTER — Ambulatory Visit (INDEPENDENT_AMBULATORY_CARE_PROVIDER_SITE_OTHER): Payer: Medicare Other | Admitting: Internal Medicine

## 2014-08-02 ENCOUNTER — Encounter (INDEPENDENT_AMBULATORY_CARE_PROVIDER_SITE_OTHER): Payer: Self-pay | Admitting: Internal Medicine

## 2014-08-02 VITALS — BP 160/80 | HR 60 | Temp 98.0°F | Ht 65.0 in | Wt 218.9 lb

## 2014-08-02 DIAGNOSIS — I10 Essential (primary) hypertension: Secondary | ICD-10-CM | POA: Insufficient documentation

## 2014-08-02 DIAGNOSIS — E119 Type 2 diabetes mellitus without complications: Secondary | ICD-10-CM | POA: Insufficient documentation

## 2014-08-02 DIAGNOSIS — R197 Diarrhea, unspecified: Secondary | ICD-10-CM

## 2014-08-02 DIAGNOSIS — K5732 Diverticulitis of large intestine without perforation or abscess without bleeding: Secondary | ICD-10-CM | POA: Insufficient documentation

## 2014-08-02 MED ORDER — LEVOFLOXACIN 500 MG PO TABS: 500.0000 mg | ORAL_TABLET | Freq: Every day | ORAL | Status: DC
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
## 2014-08-02 NOTE — Patient Instructions (Addendum)
GI pathogen.' Rx for Levaquin 500mg  daily x 10 days.  OV in 4 weeks Dicyclomine 10mg  three times a day Imodium twice a day

## 2014-08-03 ENCOUNTER — Telehealth: Payer: Self-pay | Admitting: Gastroenterology

## 2014-08-03 LAB — GASTROINTESTINAL PATHOGEN PANEL PCR
C. difficile Tox A/B, PCR: POSITIVE — CR
CAMPYLOBACTER, PCR: NEGATIVE
CRYPTOSPORIDIUM, PCR: NEGATIVE
E COLI (STEC) STX1/STX2, PCR: NEGATIVE
E coli (ETEC) LT/ST PCR: NEGATIVE
E coli 0157, PCR: NEGATIVE
GIARDIA LAMBLIA, PCR: NEGATIVE
NOROVIRUS, PCR: NEGATIVE
ROTAVIRUS, PCR: NEGATIVE
SHIGELLA, PCR: NEGATIVE
Salmonella, PCR: NEGATIVE

## 2014-08-03 MED ORDER — METRONIDAZOLE 500 MG PO TABS: ORAL_TABLET | ORAL | Status: DC

## 2014-08-03 NOTE — Telephone Encounter (Signed)
Called patient TO DISCUSS RESULTS. LVM-CALL [phone] WITH QUESTIONS. PT INFORMED SHE HAS C DIFF. SHE SHOULD STOP LEVAQUIN AND START FLAGYL. MED SIDE EFFECT WARNINGS LEFT ON VM. RX SENT.

## 2014-08-04 NOTE — Telephone Encounter (Signed)
PT CALLED. GOT VM. SAID FLAGYL MAKES HER SICK. OFFERED TO CHANGE TO VANCOMYCIN. PT DECLINED BECAUSE SHE SAID SHE CAN'T AFFORD IT. OFFERED ANTIEMETIC. PT SAID SHE ALREADY TOOK TOO MANY MEDS. SHE SAID SHE WOULD TAKE THE FLAGYL.

## 2014-08-07 NOTE — Telephone Encounter (Signed)
Talked with patient. She feels much better. She has appointment in few weeks but if symptoms relapse she will give us a call.

## 2014-08-08 NOTE — Telephone Encounter (Signed)
I have talked with patient. Feel better. Will see in 3 weeks or sooner.

## 2014-08-10 ENCOUNTER — Other Ambulatory Visit (HOSPITAL_COMMUNITY): Payer: Self-pay | Admitting: Family Medicine

## 2014-08-10 DIAGNOSIS — Z1231 Encounter for screening mammogram for malignant neoplasm of breast: Secondary | ICD-10-CM

## 2014-08-16 ENCOUNTER — Emergency Department (HOSPITAL_COMMUNITY)
Admission: EM | Admit: 2014-08-16 | Discharge: 2014-08-16 | Disposition: A | Discharge status: Home / Self Care | Payer: Medicare Other | Attending: Emergency Medicine | Admitting: Emergency Medicine

## 2014-08-16 ENCOUNTER — Encounter (HOSPITAL_COMMUNITY): Payer: Self-pay | Admitting: Emergency Medicine

## 2014-08-16 DIAGNOSIS — Z7982 Long term (current) use of aspirin: Secondary | ICD-10-CM | POA: Insufficient documentation

## 2014-08-16 DIAGNOSIS — R1084 Generalized abdominal pain: Secondary | ICD-10-CM | POA: Diagnosis not present

## 2014-08-16 DIAGNOSIS — Z794 Long term (current) use of insulin: Secondary | ICD-10-CM | POA: Diagnosis not present

## 2014-08-16 DIAGNOSIS — I10 Essential (primary) hypertension: Secondary | ICD-10-CM | POA: Insufficient documentation

## 2014-08-16 DIAGNOSIS — Z87442 Personal history of urinary calculi: Secondary | ICD-10-CM | POA: Insufficient documentation

## 2014-08-16 DIAGNOSIS — R109 Unspecified abdominal pain: Secondary | ICD-10-CM | POA: Diagnosis present

## 2014-08-16 DIAGNOSIS — E119 Type 2 diabetes mellitus without complications: Secondary | ICD-10-CM | POA: Diagnosis not present

## 2014-08-16 DIAGNOSIS — K529 Noninfective gastroenteritis and colitis, unspecified: Secondary | ICD-10-CM | POA: Diagnosis not present

## 2014-08-16 DIAGNOSIS — Z79899 Other long term (current) drug therapy: Secondary | ICD-10-CM | POA: Diagnosis not present

## 2014-08-16 MED ORDER — ONDANSETRON HCL 8 MG PO TABS: 8.0000 mg | ORAL_TABLET | Freq: Three times a day (TID) | ORAL | Status: DC | PRN

## 2014-08-16 MED ORDER — TRAMADOL HCL 50 MG PO TABS: 50.0000 mg | ORAL_TABLET | Freq: Four times a day (QID) | ORAL | Status: DC | PRN

## 2014-08-16 NOTE — ED Notes (Signed)
Pt has had 4 rounds of antibiotic for diverticulosis, pt is not better, abdominal pain, diarrhea, denies vomiting
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
## 2014-08-16 NOTE — Discharge Instructions (Signed)
Try using heat on the sore area of your abdomen, as needed. See your Dr. for a checkup, if you're not improving in 2 or 3 days.    Pain of Unknown Etiology (Pain Without a Known Cause) You have come to your caregiver because of pain. Pain can occur in any part of the body. Often there is not a definite cause. If your laboratory (blood or urine) work was normal and X-rays or other studies were normal, your caregiver may treat you without knowing the cause of the pain. An example of this is the headache. Most headaches are diagnosed by taking a history. This means your caregiver asks you questions about your headaches. Your caregiver determines a treatment based on your answers. Usually testing done for headaches is normal. Often testing is not done unless there is no response to medications. Regardless of where your pain is located today, you can be given medications to make you comfortable. If no physical cause of pain can be found, most cases of pain will gradually leave as suddenly as they came.  If you have a painful condition and no reason can be found for the pain, it is important that you follow up with your caregiver. If the pain becomes worse or does not go away, it may be necessary to repeat tests and look further for a possible cause.  Only take over-the-counter or prescription medicines for pain, discomfort, or fever as directed by your caregiver.  For the protection of your privacy, test results cannot be given over the phone. Make sure you receive the results of your test. Ask how these results are to be obtained if you have not been informed. It is your responsibility to obtain your test results.  You may continue all activities unless the activities cause more pain. When the pain lessens, it is important to gradually resume normal activities. Resume activities by beginning slowly and gradually increasing the intensity and duration of the activities or exercise. During periods of severe  pain, bed rest may be helpful. Lie or sit in any position that is comfortable.  Ice used for acute (sudden) conditions may be effective. Use a large plastic bag filled with ice and wrapped in a towel. This may provide pain relief.  See your caregiver for continued problems. Your caregiver can help or refer you for exercises or physical therapy if necessary. If you were given medications for your condition, do not drive, operate machinery or power tools, or sign legal documents for 24 hours. Do not drink alcohol, take sleeping pills, or take other medications that may interfere with treatment. See your caregiver immediately if you have pain that is becoming worse and not relieved by medications. Document Released: 03/03/2001 Document Revised: 03/29/2013 Document Reviewed: 06/08/2005 [facility] Patient Information 2015 [employer] [place]. This information is not intended to replace advice given to you by your health care provider. Make sure you discuss any questions you have with your health care provider.

## 2014-08-24 ENCOUNTER — Ambulatory Visit (HOSPITAL_COMMUNITY)
Admission: RE | Admit: 2014-08-24 | Discharge: 2014-08-24 | Disposition: A | Discharge status: Home / Self Care | Payer: Medicare Other | Source: Ambulatory Visit | Attending: Family Medicine | Admitting: Family Medicine

## 2014-08-24 DIAGNOSIS — Z1231 Encounter for screening mammogram for malignant neoplasm of breast: Secondary | ICD-10-CM | POA: Insufficient documentation

## 2014-08-28 ENCOUNTER — Ambulatory Visit (INDEPENDENT_AMBULATORY_CARE_PROVIDER_SITE_OTHER): Payer: Medicare Other | Admitting: Internal Medicine

## 2014-08-28 ENCOUNTER — Encounter (INDEPENDENT_AMBULATORY_CARE_PROVIDER_SITE_OTHER): Payer: Self-pay | Admitting: Internal Medicine

## 2014-08-28 VITALS — BP 120/62 | HR 60 | Temp 97.9°F | Ht 65.0 in | Wt 211.9 lb

## 2014-08-28 DIAGNOSIS — A0472 Enterocolitis due to Clostridium difficile, not specified as recurrent: Secondary | ICD-10-CM

## 2014-08-28 DIAGNOSIS — A047 Enterocolitis due to Clostridium difficile: Secondary | ICD-10-CM

## 2014-08-28 DIAGNOSIS — K5732 Diverticulitis of large intestine without perforation or abscess without bleeding: Secondary | ICD-10-CM
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
## 2014-08-28 NOTE — Patient Instructions (Signed)
Continue Probiotic. O V in 1 year

## 2015-06-04 ENCOUNTER — Encounter (INDEPENDENT_AMBULATORY_CARE_PROVIDER_SITE_OTHER): Payer: Self-pay | Admitting: Internal Medicine

## 2015-06-04 ENCOUNTER — Ambulatory Visit (INDEPENDENT_AMBULATORY_CARE_PROVIDER_SITE_OTHER): Payer: Medicare Other | Admitting: Internal Medicine

## 2015-06-04 VITALS — BP 128/68 | HR 64 | Temp 97.9°F | Ht 64.0 in | Wt 209.7 lb

## 2015-06-04 DIAGNOSIS — R197 Diarrhea, unspecified: Secondary | ICD-10-CM

## 2015-06-04 DIAGNOSIS — R1032 Left lower quadrant pain: Secondary | ICD-10-CM | POA: Diagnosis not present

## 2015-06-04 DIAGNOSIS — A09 Infectious gastroenteritis and colitis, unspecified: Secondary | ICD-10-CM

## 2015-06-04 MED ORDER — METRONIDAZOLE 500 MG PO TABS: 500.0000 mg | ORAL_TABLET | Freq: Two times a day (BID) | ORAL | Status: DC

## 2015-06-04 MED ORDER — CIPROFLOXACIN HCL 500 MG PO TABS: 500.0000 mg | ORAL_TABLET | Freq: Two times a day (BID) | ORAL | Status: DC

## 2015-06-04 NOTE — Patient Instructions (Signed)
Rx for Cipro 500mg  BID x 14 days and Flagyl 500mg  BID x 14 days. CBC today
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
## 2015-06-05 LAB — CBC WITH DIFFERENTIAL/PLATELET
BASOS PCT: 1 % (ref 0–1)
Basophils Absolute: 0.1 10*3/uL (ref 0.0–0.1)
EOS ABS: 0.3 10*3/uL (ref 0.0–0.7)
EOS PCT: 4 % (ref 0–5)
HCT: 38.2 % (ref 36.0–46.0)
Hemoglobin: 12.4 g/dL (ref 12.0–15.0)
LYMPHS ABS: 1.6 10*3/uL (ref 0.7–4.0)
Lymphocytes Relative: 19 % (ref 12–46)
MCH: 29.5 pg (ref 26.0–34.0)
MCHC: 32.5 g/dL (ref 30.0–36.0)
MCV: 90.7 fL (ref 78.0–100.0)
MONO ABS: 0.7 10*3/uL (ref 0.1–1.0)
MONOS PCT: 8 % (ref 3–12)
MPV: 10.6 fL (ref 8.6–12.4)
NEUTROS PCT: 68 % (ref 43–77)
Neutro Abs: 5.6 10*3/uL (ref 1.7–7.7)
PLATELETS: 247 10*3/uL (ref 150–400)
RBC: 4.21 MIL/uL (ref 3.87–5.11)
RDW: 14.8 % (ref 11.5–15.5)
WBC: 8.2 10*3/uL (ref 4.0–10.5)

## 2015-06-05 LAB — GASTROINTESTINAL PATHOGEN PANEL PCR
C. difficile Tox A/B, PCR: NEGATIVE
Campylobacter, PCR: NEGATIVE
Cryptosporidium, PCR: NEGATIVE
E COLI (ETEC) LT/ST, PCR: NEGATIVE
E coli (STEC) stx1/stx2, PCR: NEGATIVE
E coli 0157, PCR: NEGATIVE
Giardia lamblia, PCR: NEGATIVE
Norovirus, PCR: NEGATIVE
ROTAVIRUS, PCR: NEGATIVE
SALMONELLA, PCR: NEGATIVE
SHIGELLA, PCR: NEGATIVE

## 2015-06-06 ENCOUNTER — Telehealth (INDEPENDENT_AMBULATORY_CARE_PROVIDER_SITE_OTHER): Payer: Self-pay | Admitting: *Deleted

## 2015-06-06 NOTE — Telephone Encounter (Signed)
Results given to patient. She will call me Monday and with a PR report. I advised her if Abd pain worsens, to go to the ED

## 2015-06-06 NOTE — Telephone Encounter (Signed)
Wants to know if lab results are back, please call

## 2015-06-10 ENCOUNTER — Telehealth (INDEPENDENT_AMBULATORY_CARE_PROVIDER_SITE_OTHER): Payer: Self-pay | Admitting: *Deleted

## 2015-06-10 NOTE — Telephone Encounter (Signed)
Stool studies were negative. Will stop Cipro and continue with the Flagyl. She c/o itching.  She will call me Wednesday.

## 2015-06-10 NOTE — Telephone Encounter (Signed)
Left message, not hurting as bad , still a little sore and has itching, wants to know if itching coming from antibiotics -- please call [phone]

## 2015-06-19 ENCOUNTER — Telehealth (INDEPENDENT_AMBULATORY_CARE_PROVIDER_SITE_OTHER): Payer: Self-pay | Admitting: Internal Medicine

## 2015-06-19 DIAGNOSIS — R1032 Left lower quadrant pain: Secondary | ICD-10-CM

## 2015-06-19 DIAGNOSIS — R197 Diarrhea, unspecified: Secondary | ICD-10-CM

## 2015-06-19 MED ORDER — CIPROFLOXACIN HCL 500 MG PO TABS: 500.0000 mg | ORAL_TABLET | Freq: Two times a day (BID) | ORAL | Status: DC

## 2015-06-19 MED ORDER — METRONIDAZOLE 500 MG PO TABS: 500.0000 mg | ORAL_TABLET | Freq: Two times a day (BID) | ORAL | Status: DC

## 2015-06-19 NOTE — Telephone Encounter (Signed)
Still have some diarrhea. Some left lower quadrant pain. Am going to start on Cipro and Flagyl x 7 days.

## 2015-07-01 ENCOUNTER — Telehealth (INDEPENDENT_AMBULATORY_CARE_PROVIDER_SITE_OTHER): Payer: Self-pay | Admitting: *Deleted

## 2015-07-01 ENCOUNTER — Telehealth (INDEPENDENT_AMBULATORY_CARE_PROVIDER_SITE_OTHER): Payer: Self-pay | Admitting: Internal Medicine

## 2015-07-01 DIAGNOSIS — R110 Nausea: Secondary | ICD-10-CM

## 2015-07-01 DIAGNOSIS — R1032 Left lower quadrant pain: Secondary | ICD-10-CM

## 2015-07-01 NOTE — Telephone Encounter (Signed)
CT ordered. 

## 2015-07-01 NOTE — Telephone Encounter (Signed)
CT ordered to be sure she does not have diverticulitis

## 2015-07-01 NOTE — Telephone Encounter (Signed)
Wants to talk to Aurther Lofterry, still sick to stomach and stomach sore ph# [phone]

## 2015-07-03 LAB — CREATININE, SERUM: CREATININE: 1.2 mg/dL — AB (ref 0.60–0.93)

## 2015-07-04 ENCOUNTER — Ambulatory Visit (HOSPITAL_COMMUNITY)
Admission: RE | Admit: 2015-07-04 | Discharge: 2015-07-04 | Disposition: A | Discharge status: Home / Self Care | Payer: Medicare HMO | Source: Ambulatory Visit | Attending: Internal Medicine | Admitting: Internal Medicine

## 2015-07-04 DIAGNOSIS — N1330 Unspecified hydronephrosis: Secondary | ICD-10-CM | POA: Diagnosis not present

## 2015-07-04 DIAGNOSIS — K439 Ventral hernia without obstruction or gangrene: Secondary | ICD-10-CM | POA: Diagnosis not present

## 2015-07-04 DIAGNOSIS — K5730 Diverticulosis of large intestine without perforation or abscess without bleeding: Secondary | ICD-10-CM | POA: Diagnosis not present

## 2015-07-04 DIAGNOSIS — R1032 Left lower quadrant pain: Secondary | ICD-10-CM | POA: Diagnosis present

## 2015-07-04 DIAGNOSIS — R110 Nausea: Secondary | ICD-10-CM | POA: Diagnosis present

## 2015-07-04 MED ORDER — IOHEXOL 300 MG/ML  SOLN
75.0000 mL | Freq: Once | INTRAMUSCULAR | Status: AC | PRN
  Administered 2015-07-04: 75 mL via INTRAVENOUS

## 2015-07-04 MED ORDER — IOHEXOL 350 MG/ML SOLN: 75.0000 mL | Freq: Once | INTRAVENOUS | Status: DC | PRN

## 2015-07-05 ENCOUNTER — Ambulatory Visit (HOSPITAL_COMMUNITY): Payer: Medicare HMO

## 2015-07-08 ENCOUNTER — Encounter (INDEPENDENT_AMBULATORY_CARE_PROVIDER_SITE_OTHER): Payer: Self-pay | Admitting: *Deleted

## 2015-08-27 ENCOUNTER — Other Ambulatory Visit: Payer: Self-pay | Admitting: Obstetrics & Gynecology

## 2015-08-27 DIAGNOSIS — Z1231 Encounter for screening mammogram for malignant neoplasm of breast: Secondary | ICD-10-CM

## 2015-08-30 ENCOUNTER — Ambulatory Visit (HOSPITAL_COMMUNITY): Payer: Medicare HMO

## 2015-08-30 ENCOUNTER — Ambulatory Visit (HOSPITAL_COMMUNITY)
Admission: RE | Admit: 2015-08-30 | Discharge: 2015-08-30 | Disposition: A | Discharge status: Home / Self Care | Payer: Medicare HMO | Source: Ambulatory Visit | Attending: Obstetrics & Gynecology | Admitting: Obstetrics & Gynecology

## 2015-08-30 DIAGNOSIS — Z1231 Encounter for screening mammogram for malignant neoplasm of breast: Secondary | ICD-10-CM | POA: Insufficient documentation

## 2015-09-09 ENCOUNTER — Encounter: Payer: Self-pay | Admitting: Obstetrics & Gynecology

## 2015-09-09 ENCOUNTER — Ambulatory Visit (INDEPENDENT_AMBULATORY_CARE_PROVIDER_SITE_OTHER): Payer: Medicare HMO | Admitting: Obstetrics & Gynecology

## 2015-09-09 VITALS — BP 130/80 | HR 58 | Ht 63.2 in | Wt 207.0 lb

## 2015-09-09 DIAGNOSIS — Z90710 Acquired absence of both cervix and uterus: Secondary | ICD-10-CM

## 2015-09-09 DIAGNOSIS — Z01419 Encounter for gynecological examination (general) (routine) without abnormal findings: Secondary | ICD-10-CM

## 2015-09-09 DIAGNOSIS — Z1212 Encounter for screening for malignant neoplasm of rectum: Secondary | ICD-10-CM

## 2015-09-09 DIAGNOSIS — Z1211 Encounter for screening for malignant neoplasm of colon: Secondary | ICD-10-CM

## 2015-09-09 MED ORDER — NYSTATIN-TRIAMCINOLONE 100000-0.1 UNIT/GM-% EX OINT: 1.0000 "application " | TOPICAL_OINTMENT | Freq: Two times a day (BID) | CUTANEOUS | Status: DC

## 2015-09-09 MED ORDER — ZINC OXIDE 40 % EX OINT: 1.0000 "application " | TOPICAL_OINTMENT | CUTANEOUS | Status: DC | PRN
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
## 2016-01-07 ENCOUNTER — Encounter (INDEPENDENT_AMBULATORY_CARE_PROVIDER_SITE_OTHER): Payer: Self-pay | Admitting: Internal Medicine

## 2016-01-07 ENCOUNTER — Ambulatory Visit (INDEPENDENT_AMBULATORY_CARE_PROVIDER_SITE_OTHER): Payer: Medicare HMO | Admitting: Internal Medicine

## 2016-01-07 VITALS — BP 130/64 | HR 60 | Temp 97.6°F | Ht 64.0 in | Wt 210.9 lb

## 2016-01-07 DIAGNOSIS — K5732 Diverticulitis of large intestine without perforation or abscess without bleeding: Secondary | ICD-10-CM | POA: Diagnosis not present

## 2016-01-07 DIAGNOSIS — A047 Enterocolitis due to Clostridium difficile: Secondary | ICD-10-CM | POA: Diagnosis not present

## 2016-01-07 DIAGNOSIS — A0472 Enterocolitis due to Clostridium difficile, not specified as recurrent: Secondary | ICD-10-CM

## 2016-01-07 NOTE — Patient Instructions (Signed)
OV in 6 months. 
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
## 2016-01-29 DIAGNOSIS — E785 Hyperlipidemia, unspecified: Secondary | ICD-10-CM | POA: Insufficient documentation

## 2016-04-03 ENCOUNTER — Emergency Department (HOSPITAL_COMMUNITY)
Admission: EM | Admit: 2016-04-03 | Discharge: 2016-04-03 | Disposition: A | Discharge status: Home / Self Care | Payer: Medicare HMO | Attending: Emergency Medicine | Admitting: Emergency Medicine

## 2016-04-03 ENCOUNTER — Encounter (HOSPITAL_COMMUNITY): Payer: Self-pay | Admitting: Vascular Surgery

## 2016-04-03 ENCOUNTER — Emergency Department (HOSPITAL_COMMUNITY): Payer: Medicare HMO

## 2016-04-03 DIAGNOSIS — Y999 Unspecified external cause status: Secondary | ICD-10-CM | POA: Insufficient documentation

## 2016-04-03 DIAGNOSIS — I10 Essential (primary) hypertension: Secondary | ICD-10-CM | POA: Diagnosis not present

## 2016-04-03 DIAGNOSIS — Z7984 Long term (current) use of oral hypoglycemic drugs: Secondary | ICD-10-CM | POA: Insufficient documentation

## 2016-04-03 DIAGNOSIS — M542 Cervicalgia: Secondary | ICD-10-CM | POA: Diagnosis not present

## 2016-04-03 DIAGNOSIS — Y939 Activity, unspecified: Secondary | ICD-10-CM | POA: Diagnosis not present

## 2016-04-03 DIAGNOSIS — Y929 Unspecified place or not applicable: Secondary | ICD-10-CM | POA: Diagnosis not present

## 2016-04-03 DIAGNOSIS — E119 Type 2 diabetes mellitus without complications: Secondary | ICD-10-CM | POA: Diagnosis not present

## 2016-04-03 DIAGNOSIS — S0990XA Unspecified injury of head, initial encounter: Secondary | ICD-10-CM | POA: Insufficient documentation

## 2016-04-03 DIAGNOSIS — Z7982 Long term (current) use of aspirin: Secondary | ICD-10-CM | POA: Diagnosis not present

## 2016-04-03 DIAGNOSIS — W228XXA Striking against or struck by other objects, initial encounter: Secondary | ICD-10-CM | POA: Insufficient documentation

## 2016-04-03 LAB — CBC
HEMATOCRIT: 37.9 % (ref 36.0–46.0)
Hemoglobin: 12.5 g/dL (ref 12.0–15.0)
MCH: 29.4 pg (ref 26.0–34.0)
MCHC: 33 g/dL (ref 30.0–36.0)
MCV: 89.2 fL (ref 78.0–100.0)
Platelets: 205 10*3/uL (ref 150–400)
RBC: 4.25 MIL/uL (ref 3.87–5.11)
RDW: 13.9 % (ref 11.5–15.5)
WBC: 8.6 10*3/uL (ref 4.0–10.5)

## 2016-04-03 LAB — BASIC METABOLIC PANEL
Anion gap: 9 (ref 5–15)
BUN: 22 mg/dL — AB (ref 6–20)
CALCIUM: 10 mg/dL (ref 8.9–10.3)
CO2: 27 mmol/L (ref 22–32)
CREATININE: 1.12 mg/dL — AB (ref 0.44–1.00)
Chloride: 104 mmol/L (ref 101–111)
GFR calc non Af Amer: 46 mL/min — ABNORMAL LOW (ref >60)
GFR, EST AFRICAN AMERICAN: 53 mL/min — AB (ref >60)
GLUCOSE: 127 mg/dL — AB (ref 65–99)
Potassium: 4.4 mmol/L (ref 3.5–5.1)
Sodium: 140 mmol/L (ref 135–145)

## 2016-04-03 IMAGING — CT CT CERVICAL SPINE W/O CM
3 of 9 series · 9 of 33 positions shown, 10 images · non-contrast
Comparison: None.

CLINICAL DATA: Fall last [REDACTED], generalized headache and
posterior neck pain starting 2 days ago.

EXAM:
CT HEAD WITHOUT CONTRAST
CT CERVICAL SPINE WITHOUT CONTRAST
TECHNIQUE: Multidetector CT imaging of the head and cervical spine was
performed following the standard protocol without intravenous
contrast. Multiplanar CT image reconstructions of the cervical spine
were also generated.

[Series 304: coronal, idose (2) · coronal · 0.34mm/px · 1 of 100 slices shown]
[im 50/100  bone]
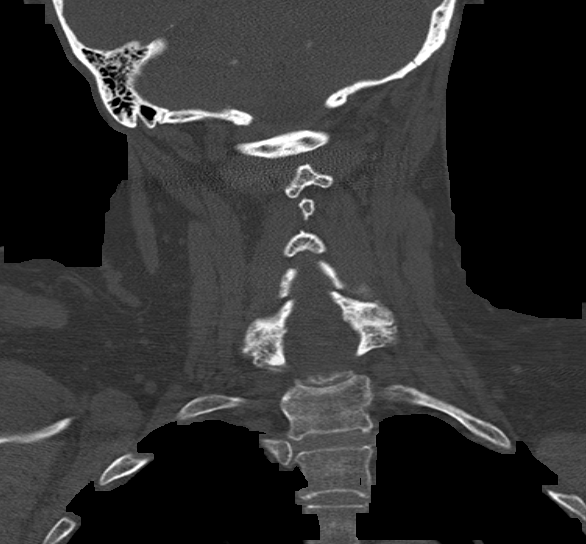

[Series 306: sagittal, idose (2) · sagittal · 0.34mm/px · 5 of 100 slices shown]
[im 15/100  bone]
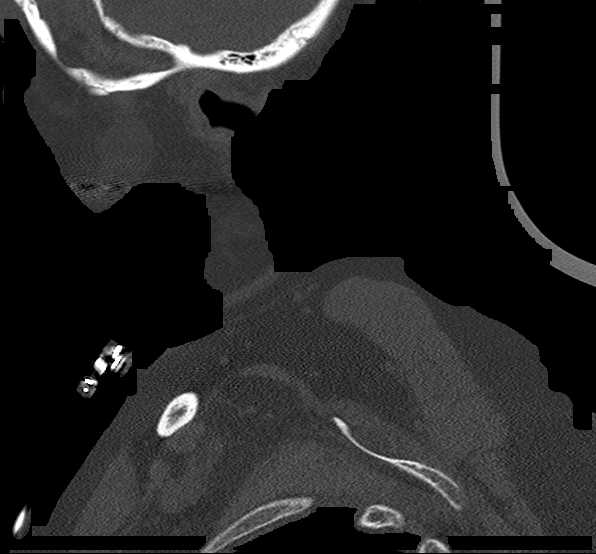
[im 29/100  bone]
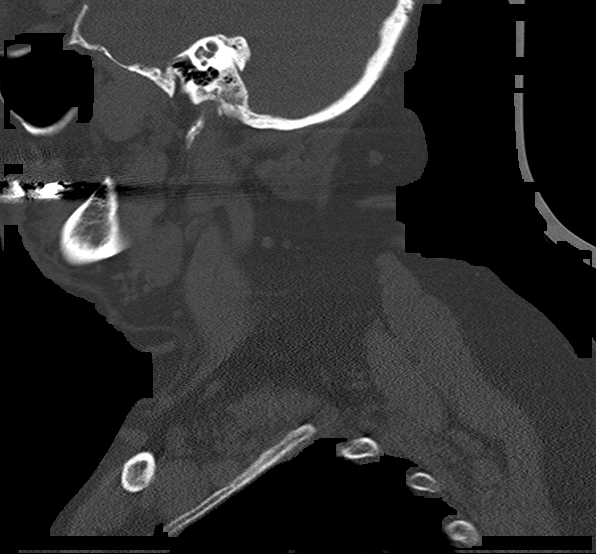
[im 43/100  bone]
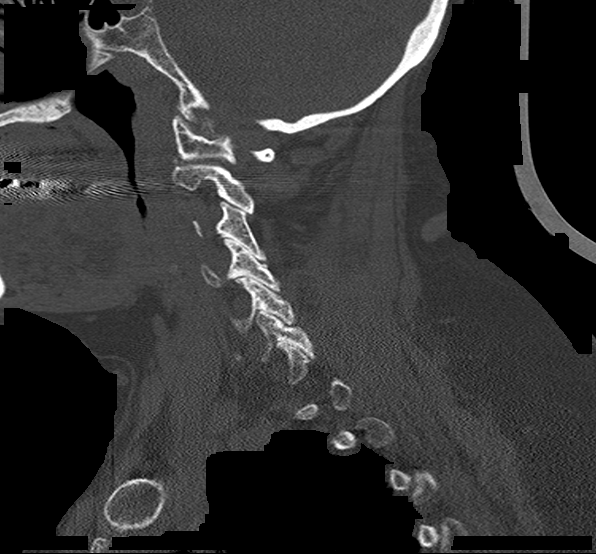
[im 57/100  bone]
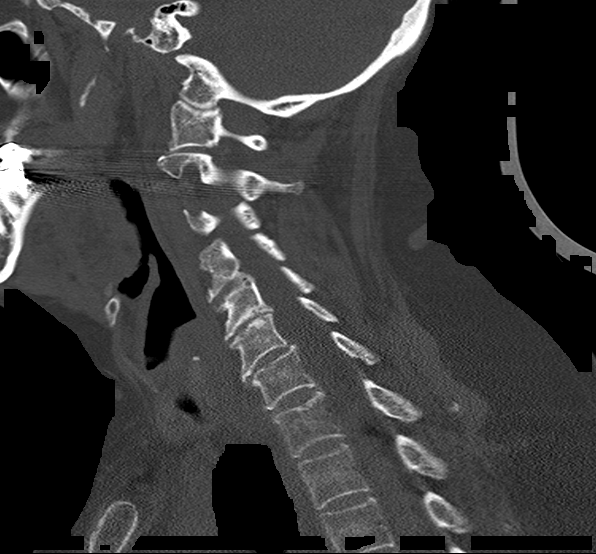
[im 71/100  bone]
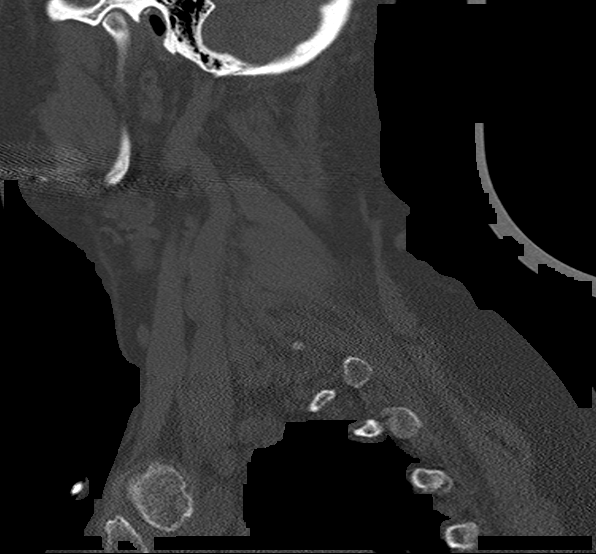

[Series 307: orthogonals, idose (2) · axial · 0.53mm/px · z∈[+57,+238]mm · 3 of 93 slices shown, 4 images]
[im 1/93  soft-tissue]
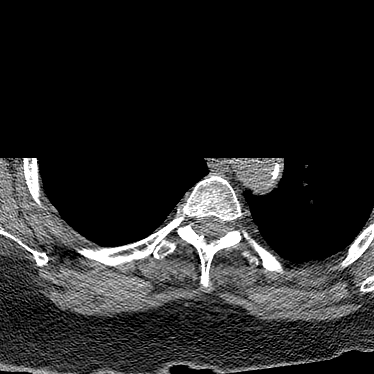
[im 1/93  bone]
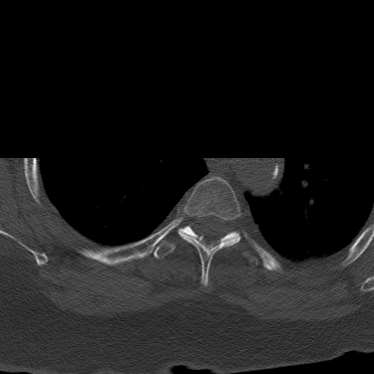
[im 47/93  bone]
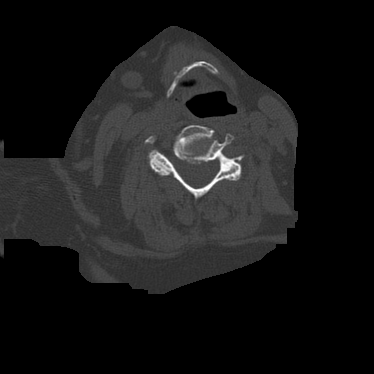
[im 93/93  bone]
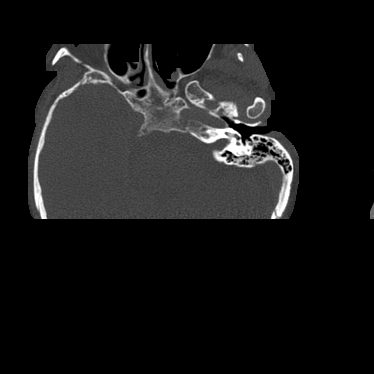

[9 of 33 positions shown; findings below may reference images not displayed]

FINDINGS: CT HEAD FINDINGS

Brain: There is mild generalized age related parenchymal atrophy
with commensurate dilatation of the ventricles and sulci. There is
no mass, hemorrhage, edema or other evidence of acute parenchymal
abnormality. No extra-axial hemorrhage.

Vascular: There are chronic calcified atherosclerotic changes of the
large vessels at the skull base. No unexpected hyperdense vessel.

Skull: Normal. Negative for fracture or focal lesion.

Sinuses/Orbits: No acute finding.

Other: None

CT CERVICAL SPINE FINDINGS

Alignment:  Mild scoliosis.  No acute subluxation.

Skull base and vertebrae: No fracture line or displaced fracture
fragment identified. Facet joints appear intact and normally
aligned.

Soft tissues and spinal canal: No prevertebral fluid or swelling. No
visible canal hematoma. Atherosclerotic calcifications noted at the
right carotid bulb region.

Disc levels: Degenerative changes throughout the cervical spine,
mild to moderate in degree. Associated disc-osteophytic bulges at
the C2-3 through C6-7 levels causing mild to moderate central canal
stenoses. Additional degenerative hypertrophy of the uncovertebral
and facet joints at the C5-6 level causing moderate neural foramen
stenoses bilaterally with possible associated nerve root
impingement.

Upper chest: Negative.

Other: None
IMPRESSION: 1. No acute intracranial abnormality. No intracranial mass,
hemorrhage or edema.
2. No fracture or acute subluxation within the cervical spine.
Chronic/degenerative changes detailed above.

## 2016-04-03 MED ORDER — PREDNISONE 10 MG PO TABS: 20.0000 mg | ORAL_TABLET | Freq: Every day | ORAL | 0 refills | Status: DC

## 2016-04-03 MED ORDER — MORPHINE SULFATE (PF) 2 MG/ML IV SOLN
2.0000 mg | Freq: Once | INTRAVENOUS | Status: AC
Start: 2016-04-03 — End: 2016-04-03
  Administered 2016-04-03: 2 mg via INTRAVENOUS
  Filled 2016-04-03: qty 1

## 2016-04-03 MED ORDER — METHYLPREDNISOLONE SODIUM SUCC 125 MG IJ SOLR
125.0000 mg | Freq: Once | INTRAMUSCULAR | Status: AC
  Administered 2016-04-03: 125 mg via INTRAVENOUS
  Filled 2016-04-03: qty 2

## 2016-04-03 MED ORDER — CYCLOBENZAPRINE HCL 10 MG PO TABS: 5.0000 mg | ORAL_TABLET | Freq: Two times a day (BID) | ORAL | 0 refills | Status: DC | PRN

## 2016-04-03 MED ORDER — OXYCODONE-ACETAMINOPHEN 5-325 MG PO TABS: 1.0000 | ORAL_TABLET | ORAL | 0 refills | Status: DC | PRN

## 2016-04-03 NOTE — ED Triage Notes (Addendum)
Pt reports to the ED from her PCP's (Dr. Lysbeth GalasNyland) office for eval of stiff neck and headache. Pt reports she fell approx 1 week ago and she did hit her head and hurt her neck. She reports she turned her head the next day and she felt a sharp pain in her left neck that radiated into her head. She also has some right sided neck pain as well. Symptom onset Tuesday with gradual worsening, acutely worse last night. Denies any N/V. Area is tender to palpation. She denies any LOC. She is on ASA q day but no other blood thinners. Denies any numbness, weakness, or tingling. Pt A&O, resp e/u, and skin warm and dry.

## 2016-04-03 NOTE — ED Provider Notes (Signed)
[facility]-EMERGENCY DEPT Provider Note   CSN: [id number] Arrival date & time: 04/03/16  0908     History   Chief Complaint Chief Complaint  Patient presents with  . Torticollis  . Head Injury    HPI Monica Murray is a 80 y.o. female.  HPI  This is a 80 year old female who presents today with neck pain. She states that she fell approximately a week ago and struck her head. She did not lose consciousness. She did not feel that she had any definitive acute pain in her neck at that time. 2 days ago she was at a funeral when she reached her right arm to adjust her left bra strap. She states at that time she began having some spasm in the left shoulder to neck area. It has worsened through the previous 2 days. She feels now that she is having spasm and pain on both sides of her neck. He ate up into the base of her skull. She has not had any new injury. She is not on any blood thinners. She has not noted any fever, nausea, vomiting, light sensitivity, weakness, numbness, or tingling. She reports no previous history of similar symptoms. She took an over-the-counter nonsteroidal yesterday morning which helped some. The pain worsened during the night and awoke her at about 2 AM. She has not taken anything since that time. She was seen at her primary care physician's office and he called EMS to bring her here with report that he thought she needed to be evaluated for meningitis.  Past Medical History:  Diagnosis Date  . Chronic constipation   . Chronic diarrhea   . Clostridium difficile infection   . Diabetes (HCC)    since 2014  . Diverticulitis   . Hypertension   . Irritable bowel syndrome   . LLQ pain   . Rectal bleed   . Renal disorder    kidney stone    Patient Active Problem List   Diagnosis Date Noted  . Diverticulitis of colon without hemorrhage 08/02/2014  . Essential hypertension 08/02/2014  . Diabetes (HCC) 08/02/2014  . Urge incontinence 08/29/2013  . IBS (irritable bowel  syndrome) 04/13/2012    Past Surgical History:  Procedure Laterality Date  . BURN TREATMENT    . COLONOSCOPY  12/06/07   NUR  . KIDNEY STONE SURGERY    . SIGMOIDOSCOPY  05/24/95   NUR    OB History    No data available       Home Medications    Prior to Admission medications   Medication Sig Start Date End Date Taking? Authorizing Provider  aspirin 81 MG tablet Take 81 mg by mouth daily.     Yes Historical Provider, MD  dicyclomine (BENTYL) 10 MG capsule Take 10 mg by mouth 4 (four) times daily -  before meals and at bedtime.   Yes Historical Provider, MD  escitalopram (LEXAPRO) 10 MG tablet Take 20 mg by mouth daily.   Yes Historical Provider, MD  furosemide (LASIX) 20 MG tablet Take 20 mg by mouth 2 (two) times daily.    Yes Historical Provider, MD  glipiZIDE (GLUCOTROL XL) 2.5 MG 24 hr tablet Take 2.5 mg by mouth daily with breakfast.   Yes Historical Provider, MD  ibuprofen (ADVIL,MOTRIN) 100 MG tablet Take 200 mg by mouth every 6 (six) hours as needed for fever.   Yes Historical Provider, MD  lactobacillus acidophilus (BACID) TABS tablet Take 2 tablets by mouth daily.    Yes Historical  Provider, MD  lisinopril (PRINIVIL,ZESTRIL) 5 MG tablet Take 5 mg by mouth daily.   Yes Historical Provider, MD  loperamide (IMODIUM) 2 MG capsule Take by mouth daily. Reported on 01/07/2016   Yes Historical Provider, MD  loratadine (CLARITIN) 10 MG tablet Take 10 mg by mouth daily.   Yes Historical Provider, MD  meloxicam (MOBIC) 7.5 MG tablet Take 7.5 mg by mouth daily.   Yes Historical Provider, MD  metoprolol tartrate (LOPRESSOR) 25 MG tablet Take 25 mg by mouth 2 (two) times daily.   Yes Historical Provider, MD  pravastatin (PRAVACHOL) 80 MG tablet Take 80 mg by mouth daily.   Yes Historical Provider, MD    Family History Family History  Problem Relation Age of Onset  . Breast cancer Sister     Social History Social History  Substance Use Topics  . Smoking status: Never Smoker  .  Smokeless tobacco: Never Used  . Alcohol use No     Allergies   Macrodantin and Sulfa antibiotics   Review of Systems Review of Systems  All other systems reviewed and are negative.    Physical Exam Updated Vital Signs BP 163/85   Pulse 62   Temp 98.2 F (36.8 C) (Oral)   Resp 10   SpO2 94%   Physical Exam  Constitutional: She is oriented to person, place, and time. She appears well-developed and well-nourished. No distress.  HENT:  Head: Normocephalic and atraumatic.  Right Ear: External ear normal.  Left Ear: External ear normal.  Nose: Nose normal.  Eyes: Conjunctivae and EOM are normal. Pupils are equal, round, and reactive to light.  Neck: Normal range of motion. Neck supple.  Pulmonary/Chest: Effort normal.  Musculoskeletal: Normal range of motion.  Neurological: She is alert and oriented to person, place, and time. She has normal reflexes. She displays normal reflexes. No cranial nerve deficit. She exhibits normal muscle tone. Coordination normal.  Skin: Skin is warm and dry.  Psychiatric: She has a normal mood and affect. Her behavior is normal. Thought content normal.  Nursing note and vitals reviewed.    ED Treatments / Results  Labs (all labs ordered are listed, but only abnormal results are displayed) Labs Reviewed  BASIC METABOLIC PANEL - Abnormal; Notable for the following:       Result Value   Glucose, Bld 127 (*)    BUN 22 (*)    Creatinine, Ser 1.12 (*)    GFR calc non Af Amer 46 (*)    GFR calc Af Amer 53 (*)    All other components within normal limits  CBC    EKG  EKG Interpretation None       Radiology Ct Head Wo Contrast  Result Date: 04/03/2016 CLINICAL DATA:  Fall last Wednesday, generalized headache and posterior neck pain starting 2 days ago. EXAM: CT HEAD WITHOUT CONTRAST CT CERVICAL SPINE WITHOUT CONTRAST TECHNIQUE: Multidetector CT imaging of the head and cervical spine was performed following the standard protocol  without intravenous contrast. Multiplanar CT image reconstructions of the cervical spine were also generated. COMPARISON:  None. FINDINGS: CT HEAD FINDINGS Brain: There is mild generalized age related parenchymal atrophy with commensurate dilatation of the ventricles and sulci. There is no mass, hemorrhage, edema or other evidence of acute parenchymal abnormality. No extra-axial hemorrhage. Vascular: There are chronic calcified atherosclerotic changes of the large vessels at the skull base. No unexpected hyperdense vessel. Skull: Normal. Negative for fracture or focal lesion. Sinuses/Orbits: No acute finding. Other: None CT  CERVICAL SPINE FINDINGS Alignment:  Mild scoliosis.  No acute subluxation. Skull base and vertebrae: No fracture line or displaced fracture fragment identified. Facet joints appear intact and normally aligned. Soft tissues and spinal canal: No prevertebral fluid or swelling. No visible canal hematoma. Atherosclerotic calcifications noted at the right carotid bulb region. Disc levels: Degenerative changes throughout the cervical spine, mild to moderate in degree. Associated disc-osteophytic bulges at the C2-3 through C6-7 levels causing mild to moderate central canal stenoses. Additional degenerative hypertrophy of the uncovertebral and facet joints at the C5-6 level causing moderate neural foramen stenoses bilaterally with possible associated nerve root impingement. Upper chest: Negative. Other: None IMPRESSION: 1. No acute intracranial abnormality. No intracranial mass, hemorrhage or edema. 2. No fracture or acute subluxation within the cervical spine. Chronic/degenerative changes detailed above. Electronically Signed   By: Bary Richard M.D.   On: 04/03/2016 10:24   Ct Cervical Spine Wo Contrast  Result Date: 04/03/2016 CLINICAL DATA:  Fall last Wednesday, generalized headache and posterior neck pain starting 2 days ago. EXAM: CT HEAD WITHOUT CONTRAST CT CERVICAL SPINE WITHOUT CONTRAST  TECHNIQUE: Multidetector CT imaging of the head and cervical spine was performed following the standard protocol without intravenous contrast. Multiplanar CT image reconstructions of the cervical spine were also generated. COMPARISON:  None. FINDINGS: CT HEAD FINDINGS Brain: There is mild generalized age related parenchymal atrophy with commensurate dilatation of the ventricles and sulci. There is no mass, hemorrhage, edema or other evidence of acute parenchymal abnormality. No extra-axial hemorrhage. Vascular: There are chronic calcified atherosclerotic changes of the large vessels at the skull base. No unexpected hyperdense vessel. Skull: Normal. Negative for fracture or focal lesion. Sinuses/Orbits: No acute finding. Other: None CT CERVICAL SPINE FINDINGS Alignment:  Mild scoliosis.  No acute subluxation. Skull base and vertebrae: No fracture line or displaced fracture fragment identified. Facet joints appear intact and normally aligned. Soft tissues and spinal canal: No prevertebral fluid or swelling. No visible canal hematoma. Atherosclerotic calcifications noted at the right carotid bulb region. Disc levels: Degenerative changes throughout the cervical spine, mild to moderate in degree. Associated disc-osteophytic bulges at the C2-3 through C6-7 levels causing mild to moderate central canal stenoses. Additional degenerative hypertrophy of the uncovertebral and facet joints at the C5-6 level causing moderate neural foramen stenoses bilaterally with possible associated nerve root impingement. Upper chest: Negative. Other: None IMPRESSION: 1. No acute intracranial abnormality. No intracranial mass, hemorrhage or edema. 2. No fracture or acute subluxation within the cervical spine. Chronic/degenerative changes detailed above. Electronically Signed   By: Bary Richard M.D.   On: 04/03/2016 10:24    Procedures Procedures (including critical care time)  Medications Ordered in ED Medications    methylPREDNISolone sodium succinate (SOLU-MEDROL) 125 mg/2 mL injection 125 mg (not administered)  morphine 2 MG/ML injection 2 mg (2 mg Intravenous Given 04/03/16 0924)     Initial Impression / Assessment and Plan / ED Course  I have reviewed the triage vital signs and the nursing notes.  Pertinent labs & imaging results that were available during my care of the patient were reviewed by me and considered in my medical decision making (see chart for details).  Clinical Course    80 year old female comes in today with neck pain. She had a fall approximately a week ago. Imaging studies obtained of her head and neck reveal no evidence of acute fracture or intracranial hemorrhage. Neck is significant for spinal stenosis and degenerative changes. She received 2 mg of morphine here and  feels somewhat improved. She has paraspinal tenderness to palpation. She has decreased range of motion turning her head to the left and is able to turn her head to the right. She has no focal neurological deficits. No evidence of bleeding or injury seen on CT scans. Plan steroids, muscle relaxants, and pain medicine. She is referred to follow-up with primary care and neurosurgery. We have discussed return precautions specifically worsening pain, fever, weakness or other neurological changes.  Final Clinical Impressions(s) / ED Diagnoses   Final diagnoses:  Neck pain    New Prescriptions New Prescriptions   CYCLOBENZAPRINE (FLEXERIL) 10 MG TABLET    Take 0.5 tablets (5 mg total) by mouth 2 (two) times daily as needed for muscle spasms.   OXYCODONE-ACETAMINOPHEN (PERCOCET/ROXICET) 5-325 MG TABLET    Take 1 tablet by mouth every 4 (four) hours as needed for severe pain.   PREDNISONE (DELTASONE) 10 MG TABLET    Take 2 tablets (20 mg total) by mouth daily.     Margarita Grizzle, MD 04/03/16 1047

## 2016-04-03 NOTE — Discharge Instructions (Signed)
Take medication as prescribed. Return if you become weak, have worsening symptoms, or have fever. Call your primary care physician for follow-up within the next 3-5 days.

## 2016-04-03 NOTE — ED Provider Notes (Signed)
She came to this, ED, because the prescription given earlier today, for Percocet was not signed and she was therefore unable to fill it.  Patient presented this prescription, which was given earlier and I destroyed.  I wrote a handwritten prescription for Percocet 5/325, #15, when necessary pain, no refill; and it was given to the patient by clinical staff   Mancel BaleElliott Eyana Stolze, MD 04/03/16 [phone]

## 2016-07-09 ENCOUNTER — Ambulatory Visit (INDEPENDENT_AMBULATORY_CARE_PROVIDER_SITE_OTHER): Payer: Medicare HMO | Admitting: Internal Medicine

## 2016-07-16 ENCOUNTER — Encounter (INDEPENDENT_AMBULATORY_CARE_PROVIDER_SITE_OTHER): Payer: Self-pay | Admitting: Internal Medicine

## 2016-07-16 ENCOUNTER — Ambulatory Visit (INDEPENDENT_AMBULATORY_CARE_PROVIDER_SITE_OTHER): Payer: Medicare HMO | Admitting: Internal Medicine

## 2016-07-16 ENCOUNTER — Encounter (INDEPENDENT_AMBULATORY_CARE_PROVIDER_SITE_OTHER): Payer: Self-pay

## 2016-07-16 VITALS — BP 144/72 | HR 65 | Temp 97.8°F | Ht 64.0 in | Wt 216.4 lb

## 2016-07-16 DIAGNOSIS — A0472 Enterocolitis due to Clostridium difficile, not specified as recurrent: Secondary | ICD-10-CM | POA: Insufficient documentation

## 2016-07-16 DIAGNOSIS — K5732 Diverticulitis of large intestine without perforation or abscess without bleeding: Secondary | ICD-10-CM

## 2016-07-16 HISTORY — DX: Enterocolitis due to Clostridium difficile, not specified as recurrent: A04.72

## 2016-07-16 NOTE — Patient Instructions (Signed)
Continue the Probiotic. OV in 1 year.

## 2016-07-16 NOTE — Progress Notes (Signed)
Subjective:    Patient ID: Monica Murray, female    DOB: 01/10/1936, 81 y.o.   MRN: [id number]  HPI  PCP Dr. Lysbeth GalasNyland HPI Here today for f/u. Hx of diverticulitis and C-dif. She tells me she is doing good. Her appetite is good. No weight loss.  She has actually gained about 6 pounds since her lat visit.  No abdominal pain.   She sometimes see mucous in her stool.  She has seen some blood in her stool. Her stools are not loose. She is not straining to have a BM.  No recent hx of diverticulitis. Hx of IBS       Her last colonoscopy was in June of 2009 which showed scattered diverticula at the sigmoid colon.  Biopsy: Tubular adenoma.     Review of Systems Past Medical History:  Diagnosis Date  . Chronic constipation   . Chronic diarrhea   . Clostridium difficile infection   . Diabetes (HCC)    since 2014  . Diverticulitis   . Enteritis due to Clostridium difficile 07/16/2016  . Hypertension   . Irritable bowel syndrome   . LLQ pain   . Rectal bleed   . Renal disorder    kidney stone    Past Surgical History:  Procedure Laterality Date  . BURN TREATMENT    . COLONOSCOPY  12/06/07   NUR  . KIDNEY STONE SURGERY    . SIGMOIDOSCOPY  05/24/95   NUR    Allergies  Allergen Reactions  . Macrodantin Nausea Only  . Sulfa Antibiotics Nausea Only    Current Outpatient Prescriptions on File Prior to Visit  Medication Sig Dispense Refill  . aspirin 81 MG tablet Take 81 mg by mouth daily.      . cyclobenzaprine (FLEXERIL) 10 MG tablet Take 0.5 tablets (5 mg total) by mouth 2 (two) times daily as needed for muscle spasms. 20 tablet 0  . dicyclomine (BENTYL) 10 MG capsule Take 10 mg by mouth 4 (four) times daily -  before meals and at bedtime.    Marland Kitchen. escitalopram (LEXAPRO) 10 MG tablet Take 20 mg by mouth daily.    . furosemide (LASIX) 20 MG tablet Take 20 mg by mouth 2 (two) times daily.     Marland Kitchen. glipiZIDE (GLUCOTROL XL) 2.5 MG 24 hr tablet Take 2.5 mg by mouth daily with  breakfast.    . ibuprofen (ADVIL,MOTRIN) 100 MG tablet Take 200 mg by mouth every 6 (six) hours as needed for fever.    . lactobacillus acidophilus (BACID) TABS tablet Take 2 tablets by mouth daily.     Marland Kitchen. lisinopril (PRINIVIL,ZESTRIL) 5 MG tablet Take 5 mg by mouth daily.    Marland Kitchen. loperamide (IMODIUM) 2 MG capsule Take by mouth daily. Reported on 01/07/2016    . metoprolol tartrate (LOPRESSOR) 25 MG tablet Take 25 mg by mouth 2 (two) times daily.    . pravastatin (PRAVACHOL) 80 MG tablet Take 80 mg by mouth daily.     No current facility-administered medications on file prior to visit.        Objective:   Physical Exam Blood pressure (!) 144/72, pulse 65, temperature 97.8 F (36.6 C), height 5\' 4"  (1.626 m), weight 216 lb 6.4 oz (98.2 kg). Alert and oriented. Skin warm and dry. Oral mucosa is moist.   . Sclera anicteric, conjunctivae is pink. Thyroid not enlarged. No cervical lymphadenopathy. Lungs clear. Heart regular rate and rhythm.  Abdomen is soft. Bowel sounds are positive. No hepatomegaly.  No abdominal masses felt. No tenderness.  No edema to lower extremities.          Assessment & Plan:  Diverticulitis. She is doing well. No abdominal pain. Her stools are formed.   She is doing well.  OV in 1 year.

## 2016-07-30 ENCOUNTER — Encounter: Payer: Self-pay | Admitting: Obstetrics & Gynecology

## 2016-07-30 ENCOUNTER — Ambulatory Visit (INDEPENDENT_AMBULATORY_CARE_PROVIDER_SITE_OTHER): Payer: Medicare HMO | Admitting: Obstetrics & Gynecology

## 2016-07-30 VITALS — BP 132/74 | HR 72 | Wt 219.0 lb

## 2016-07-30 DIAGNOSIS — K645 Perianal venous thrombosis: Secondary | ICD-10-CM | POA: Diagnosis not present

## 2016-07-30 MED ORDER — NYSTATIN-TRIAMCINOLONE 100000-0.1 UNIT/GM-% EX OINT: 1.0000 "application " | TOPICAL_OINTMENT | Freq: Two times a day (BID) | CUTANEOUS | 11 refills | Status: DC

## 2016-07-30 MED ORDER — HYDROCORTISONE ACE-PRAMOXINE 2.5-1 % RE CREA: 1.0000 "application " | TOPICAL_CREAM | Freq: Three times a day (TID) | RECTAL | 11 refills | Status: DC

## 2016-07-30 NOTE — Addendum Note (Signed)
Addended by: Lazaro ArmsEURE, Daemion Mcniel H on: 07/30/2016 11:06 AM   Modules accepted: Orders

## 2016-07-30 NOTE — Progress Notes (Addendum)
Chief Complaint  Patient presents with  . Vaginal Bleeding    doesnt know if its vaginal or rectal    Blood pressure 132/74, pulse 72, weight 219 lb (99.3 kg).  81 y.o. No obstetric history on file. No LMP recorded. Patient has had a hysterectomy. The current method of family planning is status post hysterectomy.  Outpatient Encounter Prescriptions as of 07/30/2016  Medication Sig  . Ascorbic Acid (VITAMIN C) 1000 MG tablet Take 1,000 mg by mouth daily.  Marland Kitchen. aspirin 81 MG tablet Take 81 mg by mouth daily.    Marland Kitchen. dicyclomine (BENTYL) 10 MG capsule Take 10 mg by mouth 4 (four) times daily -  before meals and at bedtime.  Marland Kitchen. escitalopram (LEXAPRO) 10 MG tablet Take 20 mg by mouth daily.  . furosemide (LASIX) 20 MG tablet Take 20 mg by mouth 2 (two) times daily.   Marland Kitchen. glipiZIDE (GLUCOTROL XL) 2.5 MG 24 hr tablet Take 2.5 mg by mouth daily with breakfast.  . lactobacillus acidophilus (BACID) TABS tablet Take 2 tablets by mouth daily.   Marland Kitchen. lisinopril (PRINIVIL,ZESTRIL) 5 MG tablet Take 5 mg by mouth daily.  . metoprolol tartrate (LOPRESSOR) 25 MG tablet Take 25 mg by mouth 2 (two) times daily.  . Multiple Vitamin (MULTIVITAMIN) tablet Take 1 tablet by mouth daily.  . pravastatin (PRAVACHOL) 80 MG tablet Take 80 mg by mouth daily.  . cyclobenzaprine (FLEXERIL) 10 MG tablet Take 0.5 tablets (5 mg total) by mouth 2 (two) times daily as needed for muscle spasms. (Patient not taking: Reported on 07/30/2016)  . hydrocortisone-pramoxine (ANALPRAM HC) 2.5-1 % rectal cream Place 1 application rectally 3 (three) times daily.  Marland Kitchen. ibuprofen (ADVIL,MOTRIN) 100 MG tablet Take 200 mg by mouth every 6 (six) hours as needed for fever.  . loperamide (IMODIUM) 2 MG capsule Take by mouth daily. Reported on 01/07/2016  . nystatin-triamcinolone ointment (MYCOLOG) Apply 1 application topically 2 (two) times daily.   No facility-administered encounter medications on file as of 07/30/2016.     Subjective Pt has had some  bleeding from her bottom for the past 3 months Doesn't know where it is coming from Worse after BM Drains at night as well  Objective General WDWN female NAD Vulva:  Atrophic no lesions Vagina:  atrophic, no discharge, no  lesions, no source of bleeding seen Cervix:  absent Uterus:  uterus absent Adnexa: ovaries:not present,    Pt has an active maybe partially thrombosed hermorrhoid   Pertinent ROS No burning with urination, frequency or urgency No nausea, vomiting or diarrhea Nor fever chills or other constitutional symptoms   Labs or studies     Impression Diagnoses this Encounter::   ICD-9-CM ICD-10-CM   1. Thrombosed external hemorrhoid 455.4 K64.5     Established relevant diagnosis(es):   Plan/Recommendations: Meds ordered this encounter  Medications  . Ascorbic Acid (VITAMIN C) 1000 MG tablet    Sig: Take 1,000 mg by mouth daily.  . hydrocortisone-pramoxine (ANALPRAM HC) 2.5-1 % rectal cream    Sig: Place 1 application rectally 3 (three) times daily.    Dispense:  30 g    Refill:  11  . nystatin-triamcinolone ointment (MYCOLOG)    Sig: Apply 1 application topically 2 (two) times daily.    Dispense:  30 g    Refill:  11    Labs or Scans Ordered: No orders of the defined types were placed in this encounter.   Management:: Not a gyn source, GI, external  hemorrhoid, patially thrombosed Meds ordered this encounter  Medications  . Ascorbic Acid (VITAMIN C) 1000 MG tablet    Sig: Take 1,000 mg by mouth daily.  . hydrocortisone-pramoxine (ANALPRAM HC) 2.5-1 % rectal cream    Sig: Place 1 application rectally 3 (three) times daily.    Dispense:  30 g    Refill:  11  . nystatin-triamcinolone ointment (MYCOLOG)    Sig: Apply 1 application topically 2 (two) times daily.    Dispense:  30 g    Refill:  11   Refer to her GI MD for management of external hemorrhoid, partially thrombosed with some skin breakdown, probably needs banding  Follow up Return  if symptoms worsen or fail to improve.        Face to face time:   minutes  Greater than 50% of the visit time was spent in counseling and coordination of care with the patient.  The summary and outline of the counseling and care coordination is summarized in the note above.   All questions were answered.  Past Medical History:  Diagnosis Date  . Chronic constipation   . Chronic diarrhea   . Clostridium difficile infection   . Diabetes (HCC)    since 2014  . Diverticulitis   . Enteritis due to Clostridium difficile 07/16/2016  . Hypertension   . Irritable bowel syndrome   . LLQ pain   . Rectal bleed   . Renal disorder    kidney stone    Past Surgical History:  Procedure Laterality Date  . BURN TREATMENT    . COLONOSCOPY  12/06/07   NUR  . KIDNEY STONE SURGERY    . SIGMOIDOSCOPY  05/24/95   NUR    OB History    No data available      Allergies  Allergen Reactions  . Macrodantin Nausea Only  . Sulfa Antibiotics Nausea Only    Social History   Social History  . Marital status: Widowed    Spouse name: N/A  . Number of children: N/A  . Years of education: N/A   Social History Main Topics  . Smoking status: Never Smoker  . Smokeless tobacco: Never Used  . Alcohol use No  . Drug use: No  . Sexual activity: No   Other Topics Concern  . None   Social History Narrative  . None    Family History  Problem Relation Age of Onset  . Breast cancer Sister

## 2016-08-19 ENCOUNTER — Telehealth: Payer: Self-pay | Admitting: Obstetrics & Gynecology

## 2016-08-19 ENCOUNTER — Other Ambulatory Visit: Payer: Self-pay | Admitting: *Deleted

## 2016-08-19 DIAGNOSIS — K645 Perianal venous thrombosis: Secondary | ICD-10-CM

## 2016-08-25 ENCOUNTER — Other Ambulatory Visit: Payer: Self-pay | Admitting: Obstetrics & Gynecology

## 2016-08-25 DIAGNOSIS — Z1231 Encounter for screening mammogram for malignant neoplasm of breast: Secondary | ICD-10-CM

## 2016-09-04 ENCOUNTER — Ambulatory Visit (HOSPITAL_COMMUNITY)
Admission: RE | Admit: 2016-09-04 | Discharge: 2016-09-04 | Disposition: A | Discharge status: Home / Self Care | Payer: Medicare HMO | Source: Ambulatory Visit | Attending: Obstetrics & Gynecology | Admitting: Obstetrics & Gynecology

## 2016-09-04 ENCOUNTER — Encounter (HOSPITAL_COMMUNITY): Payer: Self-pay

## 2016-09-04 DIAGNOSIS — Z1231 Encounter for screening mammogram for malignant neoplasm of breast: Secondary | ICD-10-CM | POA: Insufficient documentation

## 2016-09-07 ENCOUNTER — Encounter: Payer: Self-pay | Admitting: Internal Medicine

## 2016-09-09 ENCOUNTER — Telehealth (INDEPENDENT_AMBULATORY_CARE_PROVIDER_SITE_OTHER): Payer: Self-pay | Admitting: Internal Medicine

## 2016-09-09 ENCOUNTER — Telehealth: Payer: Self-pay | Admitting: Obstetrics & Gynecology

## 2016-09-09 NOTE — Telephone Encounter (Signed)
Pt called stating that she would like to speak with Dr. Despina HiddenEure regarding a procedure to remove a hemorrhoid, Pt states she has a could of questions please contact pt

## 2016-09-09 NOTE — Telephone Encounter (Signed)
Patient called, stated that she is having stomach pain and bowels have changed.  She was going all day everyday and now not at all.  Stated that her hemorrhoid is draining and she has to wear a sanitary napkin at night.  She stated it needs to be lanced prior to her appointment with [facility].  She was also under the impression she was supposed to see Dr. Darrick PennaFields and it's showing in Epic that her appointment is with Dr. Jena Gaussourk.  She wants to speak to Terri.  [phone]

## 2016-09-09 NOTE — Telephone Encounter (Signed)
I have asked patient to call [facility] office and follow up.

## 2016-09-10 NOTE — Telephone Encounter (Signed)
Patient called stating was referred to Dr Darrick PennaFields office but does not want to see Dr Jena Gaussourk. She has an appointment on 4/13 but is in a lot of pain and would like for her appointment to be changed. I called and spoke to someone at that office and we were able to change her appointment to tomorrow @ 10am. Informed patient of appointment changed and that Dr Darrick PennaFields does not do hemorrhoid banding in the office. Pt very grateful and understanding.

## 2016-09-10 NOTE — Telephone Encounter (Signed)
Honestly I can't answer much about it, I just know that Dr Darrick PennaFields will band them in the office if possible, that is about all I caan answer  Please refer her to her office for evluation and management and let pt know

## 2016-09-11 ENCOUNTER — Ambulatory Visit: Payer: Medicare HMO | Admitting: Internal Medicine

## 2016-09-11 ENCOUNTER — Other Ambulatory Visit: Payer: Self-pay

## 2016-09-11 ENCOUNTER — Telehealth: Payer: Self-pay

## 2016-09-11 ENCOUNTER — Encounter: Payer: Self-pay | Admitting: Internal Medicine

## 2016-09-11 DIAGNOSIS — K649 Unspecified hemorrhoids: Secondary | ICD-10-CM

## 2016-09-11 NOTE — Telephone Encounter (Signed)
Gastroenterology Pre-Procedure Review  Request Date: 09/11/2016 Requesting Physician:   PATIENT REVIEW QUESTIONS: The patient responded to the following health history questions as indicated:    Triaged for flex sig with banding  Instruction sheet and appt time given to pt when triaged  1. Diabetes Melitis: YES 2. Joint replacements in the past 12 months: no 3. Major health problems in the past 3 months: no 4. Has an artificial valve or MVP: no 5. Has a defibrillator: no 6. Has been advised in past to take antibiotics in advance of a procedure like teeth cleaning: no 7. Family history of colon cancer: no  8. Alcohol Use: no 9. History of sleep apnea: no  10. History of coronary artery or other vascular stents placed within the last 12 months: no    MEDICATIONS & ALLERGIES:    Patient reports the following regarding taking any blood thinners:   Plavix? no Aspirin? YES Coumadin? no Brilinta? no Xarelto? no Eliquis? no Pradaxa? no Savaysa? no Effient? no  Patient confirms/reports the following medications:  Current Outpatient Prescriptions  Medication Sig Dispense Refill  . Ascorbic Acid (VITAMIN C) 1000 MG tablet Take 1,000 mg by mouth daily.    Marland Kitchen. aspirin 81 MG tablet Take 81 mg by mouth daily.      Marland Kitchen. dicyclomine (BENTYL) 10 MG capsule Take 10 mg by mouth 2 (two) times daily.     Marland Kitchen. escitalopram (LEXAPRO) 10 MG tablet Take 20 mg by mouth daily.    . furosemide (LASIX) 20 MG tablet Take 20 mg by mouth 2 (two) times daily.     Marland Kitchen. glipiZIDE (GLUCOTROL XL) 2.5 MG 24 hr tablet Take 2.5 mg by mouth daily with breakfast.    . hydrocortisone-pramoxine (ANALPRAM HC) 2.5-1 % rectal cream Place 1 application rectally 3 (three) times daily. 30 g 11  . ibuprofen (ADVIL,MOTRIN) 100 MG tablet Take 200 mg by mouth every 6 (six) hours as needed for fever.    . lactobacillus acidophilus (BACID) TABS tablet Take 2 tablets by mouth daily.     Marland Kitchen. lisinopril (PRINIVIL,ZESTRIL) 5 MG tablet Take 5 mg  by mouth daily.    Marland Kitchen. loperamide (IMODIUM) 2 MG capsule Take by mouth as needed for diarrhea or loose stools. Reported on 01/07/2016    . metoprolol tartrate (LOPRESSOR) 25 MG tablet Take 25 mg by mouth 2 (two) times daily.    . Multiple Vitamin (MULTIVITAMIN) tablet Take 1 tablet by mouth daily.    . pravastatin (PRAVACHOL) 80 MG tablet Take 80 mg by mouth daily.    . cyclobenzaprine (FLEXERIL) 10 MG tablet Take 0.5 tablets (5 mg total) by mouth 2 (two) times daily as needed for muscle spasms. (Patient not taking: Reported on 07/30/2016) 20 tablet 0  . nystatin-triamcinolone ointment (MYCOLOG) Apply 1 application topically 2 (two) times daily. (Patient not taking: Reported on 09/11/2016) 30 g 11   No current facility-administered medications for this visit.     Patient confirms/reports the following allergies:  Allergies  Allergen Reactions  . Macrodantin Nausea Only  . Sulfa Antibiotics Nausea Only    No orders of the defined types were placed in this encounter.   AUTHORIZATION INFORMATION Primary Insurance:  ID #:   Group #:  Pre-Cert / Auth required: Pre-Cert / Auth #:   Secondary Insurance:  ID #:  Group #:  Pre-Cert / Auth required:  Pre-Cert / Auth #:   SCHEDULE INFORMATION: Procedure has been scheduled as follows:  Date:  09/15/2016  Time:  2:45 PM Location: [facility] Short Stay  This Gastroenterology Pre-Precedure Review Form is being routed to the following provider(s): Jonette Eva, MD

## 2016-09-12 NOTE — Progress Notes (Signed)
Patient is not mine.

## 2016-09-14 ENCOUNTER — Other Ambulatory Visit: Payer: Self-pay

## 2016-09-14 ENCOUNTER — Telehealth: Payer: Self-pay

## 2016-09-14 DIAGNOSIS — K649 Unspecified hemorrhoids: Secondary | ICD-10-CM | POA: Insufficient documentation

## 2016-09-14 MED ORDER — FLEET ENEMA 7-19 GM/118ML RE ENEM: 2.0000 | ENEMA | Freq: Once | RECTAL | Status: DC

## 2016-09-14 NOTE — Telephone Encounter (Signed)
Monica Murray, can you please advise. Dr. Darrick PennaFields is off today.

## 2016-09-14 NOTE — Telephone Encounter (Signed)
Can we call endo and see if pre-procedure nurse can give her the enemas prior to the procedure? (may need to report a little early). I don't want her to 'blind force" the enemas, but a third party RN may be able to better complete.

## 2016-09-14 NOTE — Telephone Encounter (Signed)
REVIEWED-NO ADDITIONAL RECOMMENDATIONS. 

## 2016-09-14 NOTE — Telephone Encounter (Signed)
Order entered for 2 fleets enemas to be given tomorrow.

## 2016-09-14 NOTE — Telephone Encounter (Signed)
Pt called office, she is scheduled for Flex Sig with hemorrhoid banding tomorrow with [facility]. She states she is unable to give herself the enemas due to her hemorrhoids and she has had 4 surgeries on her rectum.  Routing to Dr. Darrick PennaFields for advice.

## 2016-09-14 NOTE — Telephone Encounter (Signed)
REVIEWED. AGRE. ENEMAS IN PREOP.

## 2016-09-14 NOTE — Telephone Encounter (Signed)
NO PA is needed 

## 2016-09-14 NOTE — Telephone Encounter (Signed)
Called Endo and spoke to Melanie. Pt [place]n arrive at 1:15pm tomorrow and a nurse will give her 2 Fleets enemas. She can take her own enema with her if she would like. Called and informed pt. Also LMOVM and informed Endo scheduler. Shawna OrleansMelanie also advised to put order in for 2 fleets enemas.

## 2016-09-15 ENCOUNTER — Encounter (HOSPITAL_COMMUNITY)
Admission: RE | Disposition: A | Discharge status: Home / Self Care | Payer: Self-pay | Source: Ambulatory Visit | Attending: Gastroenterology

## 2016-09-15 ENCOUNTER — Telehealth: Payer: Self-pay | Admitting: Gastroenterology

## 2016-09-15 ENCOUNTER — Ambulatory Visit (HOSPITAL_COMMUNITY)
Admission: RE | Admit: 2016-09-15 | Discharge: 2016-09-15 | Disposition: A | Discharge status: Home / Self Care | Payer: Medicare HMO | Source: Ambulatory Visit | Attending: Gastroenterology | Admitting: Gastroenterology

## 2016-09-15 ENCOUNTER — Encounter (HOSPITAL_COMMUNITY): Payer: Self-pay | Admitting: *Deleted

## 2016-09-15 DIAGNOSIS — I10 Essential (primary) hypertension: Secondary | ICD-10-CM | POA: Insufficient documentation

## 2016-09-15 DIAGNOSIS — K645 Perianal venous thrombosis: Secondary | ICD-10-CM | POA: Insufficient documentation

## 2016-09-15 DIAGNOSIS — Z7984 Long term (current) use of oral hypoglycemic drugs: Secondary | ICD-10-CM | POA: Insufficient documentation

## 2016-09-15 DIAGNOSIS — Z7982 Long term (current) use of aspirin: Secondary | ICD-10-CM | POA: Insufficient documentation

## 2016-09-15 DIAGNOSIS — K625 Hemorrhage of anus and rectum: Secondary | ICD-10-CM | POA: Diagnosis present

## 2016-09-15 DIAGNOSIS — K5730 Diverticulosis of large intestine without perforation or abscess without bleeding: Secondary | ICD-10-CM | POA: Diagnosis not present

## 2016-09-15 DIAGNOSIS — K921 Melena: Secondary | ICD-10-CM | POA: Diagnosis not present

## 2016-09-15 DIAGNOSIS — K648 Other hemorrhoids: Secondary | ICD-10-CM | POA: Diagnosis not present

## 2016-09-15 DIAGNOSIS — Z79899 Other long term (current) drug therapy: Secondary | ICD-10-CM | POA: Diagnosis not present

## 2016-09-15 DIAGNOSIS — K589 Irritable bowel syndrome without diarrhea: Secondary | ICD-10-CM | POA: Insufficient documentation

## 2016-09-15 DIAGNOSIS — E119 Type 2 diabetes mellitus without complications: Secondary | ICD-10-CM | POA: Insufficient documentation

## 2016-09-15 DIAGNOSIS — K649 Unspecified hemorrhoids: Secondary | ICD-10-CM

## 2016-09-15 DIAGNOSIS — K529 Noninfective gastroenteritis and colitis, unspecified: Secondary | ICD-10-CM | POA: Insufficient documentation

## 2016-09-15 HISTORY — PX: FLEXIBLE SIGMOIDOSCOPY: SHX5431

## 2016-09-15 LAB — GLUCOSE, CAPILLARY: Glucose-Capillary: 140 mg/dL — ABNORMAL HIGH (ref 65–99)

## 2016-09-15 SURGERY — SIGMOIDOSCOPY, FLEXIBLE
Anesthesia: Moderate Sedation

## 2016-09-15 MED ORDER — MIDAZOLAM HCL 5 MG/5ML IJ SOLN
INTRAMUSCULAR | Status: DC | PRN
  Administered 2016-09-15: 1 mg via INTRAVENOUS
  Administered 2016-09-15: 2 mg via INTRAVENOUS

## 2016-09-15 MED ORDER — SODIUM CHLORIDE 0.9 % IV SOLN
INTRAVENOUS | Status: DC
  Administered 2016-09-15: 11:00:00 via INTRAVENOUS

## 2016-09-15 MED ORDER — MEPERIDINE HCL 100 MG/ML IJ SOLN
INTRAMUSCULAR | Status: AC
  Filled 2016-09-15: qty 2

## 2016-09-15 MED ORDER — STERILE WATER FOR IRRIGATION IR SOLN
Status: DC | PRN
  Administered 2016-09-15: 11:00:00

## 2016-09-15 MED ORDER — MIDAZOLAM HCL 5 MG/5ML IJ SOLN
INTRAMUSCULAR | Status: AC
  Filled 2016-09-15: qty 10

## 2016-09-15 MED ORDER — LIDOCAINE HCL 2 % EX GEL
CUTANEOUS | Status: AC
  Filled 2016-09-15: qty 30

## 2016-09-15 MED ORDER — MEPERIDINE HCL 100 MG/ML IJ SOLN
INTRAMUSCULAR | Status: DC | PRN
  Administered 2016-09-15: 25 mg via INTRAVENOUS

## 2016-09-15 NOTE — Discharge Instructions (Signed)
You have LARGE internal and external hemorrhoids. YOU DID NOT HAVE ANY POLYPS.   TO AVOID CONSTIPATION: 1. FOLLOW A HIGH FIBER DIET. AVOID ITEMS THAT CAUSE BLOATING. SEE INFO BELOW. 2. USE METAMUCIL 1 OR 2 TIMES A DAY. 3. DRINK ENOUGH WATER TO KEEP URINE LIGHT YELLOW. 4. Use Miralax if needed to keep stools soft.  TO TREAT RECTAL PAIN/BLEEDING: 1. USE PREPARATION H four times a day to relieve RECTAL bleeding and PAIN.  2. SITZ BATH THREE TIMES A DAY IF NEED TO RELIEVE RECTAL PAIN  I SPOKE WITH DR. Lovell SheehanJENKINS HE CAN GET YOU FIXED WITHIN THE NEXT 2-3 WEEKS. HIS [facility] # IS [id number]. HIS OFC IS LOCATED AT [address], [address], [place], [place]  FOLLOW UP IN 4 MOS.     Sigmoidoscopy Care After Read the instructions outlined below and refer to this sheet in the next week. These discharge instructions provide you with general information on caring for yourself after you leave the hospital. While your treatment has been planned according to the most current medical practices available, unavoidable complications occasionally occur. If you have any problems or questions after discharge, call DR. Hazleigh Mccleave, [phone].  ACTIVITY  You may resume your regular activity, but move at a slower pace for the next 24 hours.   Take frequent rest periods for the next 24 hours.   Walking will help get rid of the air and reduce the bloated feeling in your belly (abdomen).   No driving for 24 hours (because of the medicine (anesthesia) used during the test).   You may shower.   Do not sign any important legal documents or operate any machinery for 24 hours (because of the anesthesia used during the test).    NUTRITION  Drink plenty of fluids.   You may resume your normal diet as instructed by your doctor.   Begin with a light meal and progress to your normal diet. Heavy or fried foods are harder to digest and may make you feel sick to your stomach (nauseated).   Avoid alcoholic beverages for  24 hours or as instructed.    MEDICATIONS  You may resume your normal medications.   WHAT YOU CAN EXPECT TODAY  Some feelings of bloating in the abdomen.   Passage of more gas than usual.   Spotting of blood in your stool or on the toilet paper  .  IF YOU HAD POLYPS REMOVED DURING THE COLONOSCOPY:  Eat a soft diet IF YOU HAVE NAUSEA, BLOATING, ABDOMINAL PAIN, OR VOMITING.    FINDING OUT THE RESULTS OF YOUR TEST Not all test results are available during your visit. DR. Darrick PennaFIELDS WILL CALL YOU WITHIN 7 DAYS OF YOUR PROCEDUE WITH YOUR RESULTS. Do not assume everything is normal if you have not heard from DR. Trinitey Roache IN ONE WEEK, CALL HER OFFICE AT [phone].  SEEK IMMEDIATE MEDICAL ATTENTION AND CALL THE OFFICE: [phone] IF:  You have more than a spotting of blood in your stool.   Your belly is swollen (abdominal distention).   You are nauseated or vomiting.   You have a temperature over 101F.   You have abdominal pain or discomfort that is severe or gets worse throughout the day.   High-Fiber Diet A high-fiber diet changes your normal diet to include more whole grains, legumes, fruits, and vegetables. Changes in the diet involve replacing refined carbohydrates with unrefined foods. The calorie level of the diet is essentially unchanged. The Dietary Reference Intake (recommended amount) for adult males is  38 grams per day. For adult females, it is 25 grams per day. Pregnant and lactating women should consume 28 grams of fiber per day.Fiber is the intact part of a plant that is not broken down during digestion. Functional fiber is fiber that has been isolated from the plant to provide a beneficial effect in the body.  PURPOSE  Increase stool bulk.   Ease and regulate bowel movements.   Lower cholesterol.   REDUCE RISK OF COLON CANCER  INDICATIONS THAT YOU NEED MORE FIBER  Constipation and hemorrhoids.   Uncomplicated diverticulosis (intestine condition) and  irritable bowel syndrome.   Weight management.   As a protective measure against hardening of the arteries (atherosclerosis), diabetes, and cancer.   GUIDELINES FOR INCREASING FIBER IN THE DIET  Start adding fiber to the diet slowly. A gradual increase of about 5 more grams (2 slices of whole-wheat bread, 2 servings of most fruits or vegetables, or 1 bowl of high-fiber cereal) per day is best. Too rapid an increase in fiber may result in constipation, flatulence, and bloating.   Drink enough water and fluids to keep your urine clear or pale yellow. Water, juice, or caffeine-free drinks are recommended. Not drinking enough fluid may cause constipation.   Eat a variety of high-fiber foods rather than one type of fiber.   Try to increase your intake of fiber through using high-fiber foods rather than fiber pills or supplements that contain small amounts of fiber.   The goal is to change the types of food eaten. Do not supplement your present diet with high-fiber foods, but replace foods in your present diet.   INCLUDE A VARIETY OF FIBER SOURCES  Replace refined and processed grains with whole grains, canned fruits with fresh fruits, and incorporate other fiber sources. White rice, white breads, and most bakery goods contain little or no fiber.   Brown whole-grain rice, buckwheat oats, and many fruits and vegetables are all good sources of fiber. These include: broccoli, Brussels sprouts, cabbage, cauliflower, beets, sweet potatoes, white potatoes (skin on), carrots, tomatoes, eggplant, squash, berries, fresh fruits, and dried fruits.   Cereals appear to be the richest source of fiber. Cereal fiber is found in whole grains and bran. Bran is the fiber-rich outer coat of cereal grain, which is largely removed in refining. In whole-grain cereals, the bran remains. In breakfast cereals, the largest amount of fiber is found in those with "bran" in their names. The fiber content is sometimes indicated  on the label.      You may need to include additional fruits and vegetables each day.   In baking, for 1 cup white flour, you may use the following substitutions:   1 cup whole-wheat flour minus 2 tablespoons.   1/2 cup white flour plus 1/2 cup whole-wheat flour.   Hemorrhoids Hemorrhoids are dilated (enlarged) veins around the rectum. Sometimes clots will form in the veins. This makes them swollen and painful. These are called thrombosed hemorrhoids. Causes of hemorrhoids include:  Constipation.   Straining to have a bowel movement.   HEAVY LIFTING  HOME CARE INSTRUCTIONS  Eat a well balanced diet and drink 6 to 8 glasses of water every day to avoid constipation. You may also use a bulk laxative.   Avoid straining to have bowel movements.   Keep anal area dry and clean.   Do not use a donut shaped pillow or sit on the toilet for long periods. This increases blood pooling and pain.   Move your  bowels when your body has the urge; this will require less straining and will decrease pain and pressure.

## 2016-09-15 NOTE — Op Note (Addendum)
[facility] Patient Name: Monica Murray Procedure Date: 09/15/2016 9:50 AM MRN: [id number] Date of Birth: 1936/03/31 Attending MD: Jonette Eva , MD CSN: [id number] Age: 81 Admit Type: Outpatient Procedure:                Flexible Sigmoidoscopy, DIAGNOSTIC Indications:              Hematochezia Providers:                Jonette Eva, MD, Loma Messing B. Patsy Lager, RN, Dyann Ruddle Referring MD:             Lionel December, MD Medicines:                Meperidine 25 mg IV, Midazolam 3 mg IV Complications:            No immediate complications. Estimated Blood Loss:     Estimated blood loss was minimal. Procedure:                Pre-Anesthesia Assessment:                           - Prior to the procedure, a History and Physical                            was performed, and patient medications and                            allergies were reviewed. The patient's tolerance of                            previous anesthesia was also reviewed. The risks                            and benefits of the procedure and the sedation                            options and risks were discussed with the patient.                            All questions were answered, and informed consent                            was obtained. Prior Anticoagulants: The patient has                            taken aspirin, last dose was 1 day prior to                            procedure. ASA Grade Assessment: II - A patient                            with mild systemic disease. After reviewing the  risks and benefits, the patient was deemed in                            satisfactory condition to undergo the procedure.                            After obtaining informed consent, the scope was                            passed under direct vision. The EC-[id number]                            ([id number]) scope was introduced through the anus                            and  advanced to the the sigmoid colon. The flexible                            sigmoidoscopy was accomplished without difficulty.                            The patient tolerated the procedure well. The                            quality of the bowel preparation was adequate. Scope In: 11:22:33 AM Scope Out: 11:35:17 AM Total Procedure Duration: 0 hours 12 minutes 44 seconds  Findings:      The perianal exam findings include thrombosed external hemorrhoids.      Internal hemorrhoids were found during retroflexion. The hemorrhoids       were moderate.      Multiple small and large-mouthed diverticula were found in the sigmoid       colon. Impression:               - RECTAL BLEEDING DUE TO INTERNAL hemorrhoids                           - THROMBOSED EXTERNAL hemorrhoids.                           - MODERATE Diverticulosis in the sigmoid colon. Moderate Sedation:      Moderate (conscious) sedation was administered by the endoscopy nurse       and supervised by the endoscopist. The following parameters were       monitored: oxygen saturation, heart rate, blood pressure, and response       to care. Total physician intraservice time was 20 minutes. Recommendation:           - Continue present medications.                           - High fiber diet.                           - Refer to a surgeon in 2 weeks. DISCUSSED WITH DR.  JENKINS.                           - Return to my office OR TO SEE DR. Karilyn Cota in 4                            months.                           - Patient has a contact number available for                            emergencies. The signs and symptoms of potential                            delayed complications were discussed with the                            patient. Return to normal activities tomorrow.                            Written discharge instructions were provided to the                            patient. Procedure Code(s):         --- Professional ---                           [phone], Sigmoidoscopy, flexible; diagnostic,                            including collection of specimen(s) by brushing or                            washing, when performed (separate procedure)                           99152, Moderate sedation services provided by the                            same physician or other qualified health care                            professional performing the diagnostic or                            therapeutic service that the sedation supports,                            requiring the presence of an independent trained                            observer to assist in the monitoring of the  patient's level of consciousness and physiological                            status; initial 15 minutes of intraservice time,                            patient age 42 years or older Diagnosis Code(s):        --- Professional ---                           K64.5, Perianal venous thrombosis                           K92.1, Melena (includes Hematochezia)                           K57.30, Diverticulosis of large intestine without                            perforation or abscess without bleeding CPT copyright 2016 American Medical Association. All rights reserved. The codes documented in this report are preliminary and upon coder review may  be revised to meet current compliance requirements. Jonette EvaSandi Yanni Quiroa, MD Jonette EvaSandi Muad Noga, MD 09/15/2016 12:06:11 PM This report has been signed electronically. Number of Addenda: 0

## 2016-09-15 NOTE — Telephone Encounter (Signed)
SEE SURGERY Dx: HEMORRHOIDS/RECTAL BLEEDING/ DISCUSSED WITH DR. Lovell SheehanJENKINS IN 2 WEEKS.

## 2016-09-15 NOTE — H&P (Signed)
Primary Care Physician:  Josue Hector, MD Primary Gastroenterologist:  Dr. Darrick Penna  Pre-Procedure History & Physical: HPI:  Monica Murray is a 81 y.o. female here for  RECTAL BLEEDING/pain.  Past Medical History:  Diagnosis Date  . Chronic constipation   . Chronic diarrhea   . Clostridium difficile infection   . Diabetes (HCC)    since 2014  . Diverticulitis   . Enteritis due to Clostridium difficile 07/16/2016  . Hypertension   . Irritable bowel syndrome   . LLQ pain   . Rectal bleed   . Renal disorder    kidney stone    Past Surgical History:  Procedure Laterality Date  . BREAST LUMPECTOMY Right    x2  . BURN TREATMENT    . COLONOSCOPY  12/06/07   NUR  . KIDNEY STONE SURGERY    . SIGMOIDOSCOPY  05/24/95   NUR  . TONSILLECTOMY      Prior to Admission medications   Medication Sig Start Date End Date Taking? Authorizing Provider  Ascorbic Acid (VITAMIN C) 1000 MG tablet Take 1,000 mg by mouth daily.   Yes Historical Provider, MD  aspirin 81 MG tablet Take 81 mg by mouth daily.     Yes Historical Provider, MD  dicyclomine (BENTYL) 10 MG capsule Take 10 mg by mouth 2 (two) times daily.    Yes Historical Provider, MD  escitalopram (LEXAPRO) 20 MG tablet Take 20 mg by mouth daily.   Yes Historical Provider, MD  furosemide (LASIX) 20 MG tablet Take 20 mg by mouth 2 (two) times daily.    Yes Historical Provider, MD  glipiZIDE (GLUCOTROL XL) 2.5 MG 24 hr tablet Take 2.5 mg by mouth daily with breakfast.   Yes Historical Provider, MD  lisinopril (PRINIVIL,ZESTRIL) 5 MG tablet Take 5 mg by mouth daily.   Yes Historical Provider, MD  loperamide (IMODIUM) 2 MG capsule Take by mouth as needed for diarrhea or loose stools. Reported on 01/07/2016   Yes Historical Provider, MD  metoprolol tartrate (LOPRESSOR) 25 MG tablet Take 25 mg by mouth 2 (two) times daily.   Yes Historical Provider, MD  Multiple Vitamin (MULTIVITAMIN) tablet Take 1 tablet by mouth daily.   Yes  Historical Provider, MD  pravastatin (PRAVACHOL) 80 MG tablet Take 80 mg by mouth daily.   Yes Historical Provider, MD  Probiotic Product ([employer] PROBIOTIC MAXIMUM STRENGTH) CAPS Take 1 capsule by mouth daily.   Yes Historical Provider, MD    Allergies as of 09/11/2016 - Review Complete 09/11/2016  Allergen Reaction Noted  . Macrodantin Nausea Only 02/05/2011  . Sulfa antibiotics Nausea Only 02/05/2011    Family History  Problem Relation Age of Onset  . Breast cancer Sister   . Breast cancer Mother   . Breast cancer Other     Social History   Social History  . Marital status: Widowed    Spouse name: N/A  . Number of children: N/A  . Years of education: N/A   Occupational History  . Not on file.   Social History Main Topics  . Smoking status: Never Smoker  . Smokeless tobacco: Never Used  . Alcohol use No  . Drug use: No  . Sexual activity: No   Other Topics Concern  . Not on file   Social History Narrative  . No narrative on file    Review of Systems: See HPI, otherwise negative ROS   Physical Exam: Ht 5\' 4"  (1.626 m)   Wt 212 lb (96.2 kg)  BMI 36.39 kg/m  General:   Alert,  pleasant and cooperative in NAD Head:  Normocephalic and atraumatic. Neck:  Supple; Lungs:  Clear throughout to auscultation.    Heart:  Regular rate and rhythm. Abdomen:  Soft, nontender and nondistended. Normal bowel sounds, without guarding, and without rebound.   Neurologic:  Alert and  oriented x4;  grossly normal neurologically.  Impression/Plan:     RECTAL BLEEDING/pain  PLAN:  1. FLEX SIG-BANDING TODAY. DISCUSSED PROCEDURE, BENEFITS, & RISKS: < 1% chance of medication reaction, bleeding, or perforation.

## 2016-09-16 ENCOUNTER — Other Ambulatory Visit: Payer: Self-pay

## 2016-09-16 DIAGNOSIS — K649 Unspecified hemorrhoids: Secondary | ICD-10-CM

## 2016-09-16 NOTE — Telephone Encounter (Signed)
Referral has been made.

## 2016-09-17 ENCOUNTER — Encounter (HOSPITAL_COMMUNITY): Payer: Self-pay | Admitting: Gastroenterology

## 2016-09-28 NOTE — Telephone Encounter (Signed)
Monica Murray at Dr. Lovell Sheehan' office called our office this morning and said that pt cancelled her appt for tomorrow with Dr. Lovell Sheehan.

## 2016-09-29 ENCOUNTER — Ambulatory Visit: Payer: Medicare HMO | Admitting: General Surgery

## 2016-10-02 ENCOUNTER — Encounter: Payer: Medicare HMO | Admitting: Internal Medicine

## 2016-11-26 ENCOUNTER — Other Ambulatory Visit: Payer: Self-pay | Admitting: Obstetrics & Gynecology

## 2016-11-26 ENCOUNTER — Telehealth: Payer: Self-pay | Admitting: *Deleted

## 2016-11-26 ENCOUNTER — Telehealth: Payer: Self-pay | Admitting: Obstetrics & Gynecology

## 2016-11-26 MED ORDER — METRONIDAZOLE 0.75 % VA GEL: VAGINAL | 0 refills | Status: DC

## 2016-11-26 NOTE — Telephone Encounter (Signed)
Informed patient prescription was sent to pharmacy. 

## 2016-11-26 NOTE — Telephone Encounter (Signed)
metrogel e prescribed 

## 2016-11-26 NOTE — Telephone Encounter (Signed)
Patient called stating a prescription for a cream for a bacterial infection was to be sent to her pharmacy back in February. States she has broken out and would like the medication sent. Please advise.

## 2016-11-26 NOTE — Telephone Encounter (Signed)
Patient states she needed Mycolog not Metrogel for pain under breast. Please advise.

## 2016-11-26 NOTE — Telephone Encounter (Signed)
Pt called stating that she was suppose to have a prescription of a cream sent to her pharmacy [facility] [place]. Pt states that she has broken out really bad and would like the medication sent. Please contact pt

## 2016-11-27 ENCOUNTER — Other Ambulatory Visit: Payer: Self-pay | Admitting: Obstetrics & Gynecology

## 2016-11-27 MED ORDER — NYSTATIN-TRIAMCINOLONE 100000-0.1 UNIT/GM-% EX OINT: 1.0000 "application " | TOPICAL_OINTMENT | Freq: Two times a day (BID) | CUTANEOUS | 11 refills | Status: DC

## 2016-11-27 NOTE — Telephone Encounter (Signed)
Informed patient prescription for Mycolog was sent to pharmacy.

## 2016-12-28 ENCOUNTER — Ambulatory Visit: Payer: Medicare HMO | Admitting: Gastroenterology

## 2017-03-25 ENCOUNTER — Encounter (HOSPITAL_COMMUNITY): Payer: Self-pay | Admitting: Emergency Medicine

## 2017-03-25 ENCOUNTER — Emergency Department (HOSPITAL_COMMUNITY)
Admission: EM | Admit: 2017-03-25 | Discharge: 2017-03-25 | Disposition: A | Discharge status: Home / Self Care | Payer: Medicare HMO | Attending: Emergency Medicine | Admitting: Emergency Medicine

## 2017-03-25 DIAGNOSIS — Z7984 Long term (current) use of oral hypoglycemic drugs: Secondary | ICD-10-CM | POA: Diagnosis not present

## 2017-03-25 DIAGNOSIS — Z79899 Other long term (current) drug therapy: Secondary | ICD-10-CM | POA: Diagnosis not present

## 2017-03-25 DIAGNOSIS — I10 Essential (primary) hypertension: Secondary | ICD-10-CM | POA: Insufficient documentation

## 2017-03-25 DIAGNOSIS — E119 Type 2 diabetes mellitus without complications: Secondary | ICD-10-CM | POA: Insufficient documentation

## 2017-03-25 DIAGNOSIS — Z7982 Long term (current) use of aspirin: Secondary | ICD-10-CM | POA: Diagnosis not present

## 2017-03-25 DIAGNOSIS — R197 Diarrhea, unspecified: Secondary | ICD-10-CM | POA: Diagnosis not present

## 2017-03-25 DIAGNOSIS — R112 Nausea with vomiting, unspecified: Secondary | ICD-10-CM | POA: Diagnosis present

## 2017-03-25 LAB — CBC WITH DIFFERENTIAL/PLATELET
BASOS PCT: 0 %
Basophils Absolute: 0 10*3/uL (ref 0.0–0.1)
EOS ABS: 0.1 10*3/uL (ref 0.0–0.7)
Eosinophils Relative: 1 %
HEMATOCRIT: 41.1 % (ref 36.0–46.0)
HEMOGLOBIN: 13.3 g/dL (ref 12.0–15.0)
LYMPHS ABS: 0.2 10*3/uL — AB (ref 0.7–4.0)
Lymphocytes Relative: 1 %
MCH: 29 pg (ref 26.0–34.0)
MCHC: 32.4 g/dL (ref 30.0–36.0)
MCV: 89.5 fL (ref 78.0–100.0)
Monocytes Absolute: 0.2 10*3/uL (ref 0.1–1.0)
Monocytes Relative: 2 %
NEUTROS ABS: 13.8 10*3/uL — AB (ref 1.7–7.7)
NEUTROS PCT: 96 %
Platelets: 184 10*3/uL (ref 150–400)
RBC: 4.59 MIL/uL (ref 3.87–5.11)
RDW: 14.1 % (ref 11.5–15.5)
WBC: 14.3 10*3/uL — AB (ref 4.0–10.5)

## 2017-03-25 LAB — BASIC METABOLIC PANEL
ANION GAP: 11 (ref 5–15)
BUN: 30 mg/dL — ABNORMAL HIGH (ref 6–20)
CHLORIDE: 100 mmol/L — AB (ref 101–111)
CO2: 25 mmol/L (ref 22–32)
CREATININE: 0.99 mg/dL (ref 0.44–1.00)
Calcium: 10 mg/dL (ref 8.9–10.3)
GFR calc non Af Amer: 53 mL/min — ABNORMAL LOW (ref >60)
Glucose, Bld: 225 mg/dL — ABNORMAL HIGH (ref 65–99)
Potassium: 4.2 mmol/L (ref 3.5–5.1)
SODIUM: 136 mmol/L (ref 135–145)

## 2017-03-25 MED ORDER — ONDANSETRON 8 MG PO TBDP
8.0000 mg | ORAL_TABLET | Freq: Once | ORAL | Status: AC
Start: 2017-03-25 — End: 2017-03-25
  Administered 2017-03-25: 8 mg via ORAL
  Filled 2017-03-25: qty 1

## 2017-03-25 MED ORDER — SODIUM CHLORIDE 0.9 % IV BOLUS (SEPSIS)
500.0000 mL | Freq: Once | INTRAVENOUS | Status: AC
  Administered 2017-03-25: 500 mL via INTRAVENOUS

## 2017-03-25 NOTE — Discharge Instructions (Signed)
Start with clear liquids and gradually advance to a low fiber diet after a day or 2.  Use Tylenol for pain or fever.  See your doctor for checkup in 1 week, and as needed.

## 2017-03-25 NOTE — ED Triage Notes (Signed)
Onset early this morning, diarrhea, dry heaves, nausea, chills, great grandson had same symptoms 2 days ago.

## 2017-03-25 NOTE — ED Provider Notes (Signed)
[facility] Provider Note   CSN: [id number] Arrival date & time: 03/25/17  0918     History   Chief Complaint Chief Complaint  Patient presents with  . Diarrhea    HPI Monica Murray is a 81 y.o. female.   She presents for evaluation of nausea, dry heaving, and persistent diarrhea for 2 days.  Stool is thin, brown color.  She took care of her grandchild several days ago after an episode of illness with profuse diarrhea.  She had a fever at home this morning, up to 101.2, but did not take anything for it.  She denies weakness, dizziness, back pain, or difficulty walking.  No recent antibiotic use.  There are no other known modifying factors.  HPI  Past Medical History:  Diagnosis Date  . Chronic constipation   . Chronic diarrhea   . Clostridium difficile infection   . Diabetes (HCC)    since 2014  . Diverticulitis   . Enteritis due to Clostridium difficile 07/16/2016  . Hypertension   . Irritable bowel syndrome   . LLQ pain   . Rectal bleed   . Renal disorder    kidney stone    Patient Active Problem List   Diagnosis Date Noted  . Hemorrhoid 09/14/2016  . Enteritis due to Clostridium difficile 07/16/2016  . Diverticulitis of colon without hemorrhage 08/02/2014  . Essential hypertension 08/02/2014  . Diabetes (HCC) 08/02/2014  . Urge incontinence 08/29/2013  . IBS (irritable bowel syndrome) 04/13/2012    Past Surgical History:  Procedure Laterality Date  . BREAST LUMPECTOMY Right    x2  . BURN TREATMENT    . COLONOSCOPY  12/06/07   NUR  . FLEXIBLE SIGMOIDOSCOPY N/A 09/15/2016   Procedure: FLEXIBLE SIGMOIDOSCOPY;  Surgeon: West Bali, MD;  Location: [facility];  Service: Endoscopy;  Laterality: N/A;  2:45 pm  . KIDNEY STONE SURGERY    . SIGMOIDOSCOPY  05/24/95   NUR  . TONSILLECTOMY      OB History    No data available       Home Medications    Prior to Admission medications   Medication Sig Start Date End Date Taking? Authorizing  Provider  Ascorbic Acid (VITAMIN C) 1000 MG tablet Take 1,000 mg by mouth daily.   Yes [provider]  aspirin EC 81 MG tablet Take 81 mg by mouth daily.   Yes [provider]  dicyclomine (BENTYL) 10 MG capsule Take 10 mg by mouth 2 (two) times daily.    Yes [provider]  escitalopram (LEXAPRO) 20 MG tablet Take 20 mg by mouth daily.   Yes [provider]  furosemide (LASIX) 20 MG tablet Take 20 mg by mouth 2 (two) times daily.    Yes [provider]  glipiZIDE (GLUCOTROL XL) 2.5 MG 24 hr tablet Take 2.5 mg by mouth daily with breakfast.   Yes [provider]  lisinopril (PRINIVIL,ZESTRIL) 5 MG tablet Take 5 mg by mouth daily.   Yes [provider]  loperamide (IMODIUM) 2 MG capsule Take by mouth as needed for diarrhea or loose stools. Reported on 01/07/2016   Yes [provider]  metoprolol tartrate (LOPRESSOR) 25 MG tablet Take 25 mg by mouth 2 (two) times daily.   Yes [provider]  Multiple Vitamin (MULTIVITAMIN) tablet Take 1 tablet by mouth daily.   Yes [provider]  nystatin-triamcinolone ointment (MYCOLOG) Apply 1 application topically 2 (two) times daily. Patient taking differently: Apply 1  application topically 2 (two) times daily as needed (rash).  11/27/16  Yes Lazaro Arms, MD  pravastatin (PRAVACHOL) 80 MG tablet Take 80 mg by mouth at bedtime.    Yes [provider]  Probiotic Product ([employer] PROBIOTIC MAXIMUM STRENGTH) CAPS Take 1 capsule by mouth daily.   Yes [provider]  metroNIDAZOLE (METROGEL VAGINAL) 0.75 % vaginal gel Nightly x 5 nights Patient not taking: Reported on 03/25/2017 11/26/16   Lazaro Arms, MD    Family History Family History  Problem Relation Age of Onset  . Breast cancer Sister   . Breast cancer Mother   . Breast cancer Other     Social History Social History  Substance Use Topics  . Smoking status: Never Smoker  . Smokeless tobacco:  Never Used  . Alcohol use No     Allergies   Macrodantin and Sulfa antibiotics   Review of Systems Review of Systems  All other systems reviewed and are negative.    Physical Exam Updated Vital Signs BP (!) 123/91   Pulse 90   Temp 98.2 F (36.8 C) (Oral)   Resp 20   Ht  (1.626 m)   Wt 90.7 kg (200 lb)   SpO2 91%   BMI 34.33 kg/m   Physical Exam  Constitutional: She is oriented to person, place, and time. She appears well-developed. She appears distressed (She is uncomfortable).  Elderly, frail  HENT:  Head: Normocephalic and atraumatic.  Eyes: Pupils are equal, round, and reactive to light. Conjunctivae and EOM are normal.  Neck: Normal range of motion and phonation normal. Neck supple.  Cardiovascular: Normal rate.   Pulmonary/Chest: Effort normal. She exhibits no tenderness.  Abdominal: She exhibits no distension.  Musculoskeletal: Normal range of motion.  Neurological: She is alert and oriented to person, place, and time. She exhibits normal muscle tone.  Skin: Skin is warm and dry.  Psychiatric: She has a normal mood and affect. Her behavior is normal. Judgment and thought content normal.  Nursing note and vitals reviewed.    ED Treatments / Results  Labs (all labs ordered are listed, but only abnormal results are displayed) Labs Reviewed  BASIC METABOLIC PANEL - Abnormal; Notable for the following:       Result Value   Chloride 100 (*)    Glucose, Bld 225 (*)    BUN 30 (*)    GFR calc non Af Amer 53 (*)    All other components within normal limits  CBC WITH DIFFERENTIAL/PLATELET - Abnormal; Notable for the following:    WBC 14.3 (*)    Neutro Abs 13.8 (*)    Lymphs Abs 0.2 (*)    All other components within normal limits    EKG  EKG Interpretation None       Radiology No results found.  Procedures Procedures (including critical care time)  Medications Ordered in ED Medications  ondansetron (ZOFRAN-ODT) disintegrating tablet 8  mg (8 mg Oral Given 03/25/17 1011)  sodium chloride 0.9 % bolus 500 mL (0 mLs Intravenous Stopped 03/25/17 1145)     Initial Impression / Assessment and Plan / ED Course  I have reviewed the triage vital signs and the nursing notes.  Pertinent labs & imaging results that were available during my care of the patient were reviewed by me and considered in my medical decision making (see chart for details).  Clinical Course as of Mar 25 1338  Thu Mar 25, 2017  1256 High Glucose: (!) 225 [  EW]  1256 High BUN: (!) 30 [EW]  1256 High WBC: (!) 14.3 [EW]  1331 High Glucose: (!) 225 [EW]  1331 Mild elevation Chloride: (!) 100 [EW]  1332 High WBC: (!) 14.3 [EW]  1332 High Pulse Rate: 83 [EW]    Clinical Course User Index [EW] Mancel Bale, MD     Patient Vitals for the past 24 hrs:  BP Temp Temp src Pulse Resp SpO2 Height Weight  03/25/17 1230 (!) 123/91 - - 90 - 91 % - -  03/25/17 1130 135/71 - - 86 - 95 % - -  03/25/17 1100 (!) 145/67 - - 85 - (!) 87 % - -  03/25/17 1030 (!) 159/66 - - 88 - 90 % - -  03/25/17 1000 (!) 165/72 - - 93 - 95 % - -  03/25/17 0947 (!) 185/74 98.2 F (36.8 C) Oral 83 20 94 %  (1.626 m) 90.7 kg (200 lb)    1:16 PM Reevaluation with update and discussion. After initial assessment and treatment, an updated evaluation reveals she is feeling better and tolerating oral liquids at this time.  Findings discussed with the patient and all questions were answered. Noma Quijas L      Final Clinical Impressions(s) / ED Diagnoses   Final diagnoses:  Diarrhea, unspecified type    Evaluation is consistent with infectious diarrhea, community-acquired.  Patient is nontoxic, with reassuring laboratory results.  Mild BUN elevation is consistent with dehydration.  Creatinine is normal.  Patient tolerating oral fluids in the ED.  Nursing Notes Reviewed/ Care Coordinated Applicable Imaging Reviewed Interpretation of Laboratory Data incorporated into ED  treatment  The patient appears reasonably screened and/or stabilized for discharge and I doubt any other medical condition or other [facility] requiring further screening, evaluation, or treatment in the ED at this time prior to discharge.  Plan: Home Medications-APAP for pain or fever, continue usual medications; Home Treatments-, gradually advance diet, low fiber diet for now; return here if the recommended treatment, does not improve the symptoms; Recommended follow up-PCP checkup in 1 week and as needed.    New Prescriptions New Prescriptions   No medications on file     Mancel Bale, MD 03/25/17 [phone]

## 2017-06-21 ENCOUNTER — Encounter (INDEPENDENT_AMBULATORY_CARE_PROVIDER_SITE_OTHER): Payer: Self-pay | Admitting: Internal Medicine

## 2017-07-19 ENCOUNTER — Ambulatory Visit (INDEPENDENT_AMBULATORY_CARE_PROVIDER_SITE_OTHER): Payer: Medicare HMO | Admitting: Internal Medicine

## 2017-07-29 ENCOUNTER — Other Ambulatory Visit (HOSPITAL_COMMUNITY): Payer: Self-pay | Admitting: Family Medicine

## 2017-07-29 DIAGNOSIS — Z1231 Encounter for screening mammogram for malignant neoplasm of breast: Secondary | ICD-10-CM

## 2017-08-18 ENCOUNTER — Ambulatory Visit (INDEPENDENT_AMBULATORY_CARE_PROVIDER_SITE_OTHER): Payer: Medicare HMO | Admitting: Internal Medicine

## 2017-08-18 ENCOUNTER — Telehealth (INDEPENDENT_AMBULATORY_CARE_PROVIDER_SITE_OTHER): Payer: Self-pay | Admitting: *Deleted

## 2017-08-18 ENCOUNTER — Encounter (INDEPENDENT_AMBULATORY_CARE_PROVIDER_SITE_OTHER): Payer: Self-pay | Admitting: Internal Medicine

## 2017-08-18 VITALS — BP 180/90 | HR 60 | Temp 98.0°F | Ht 64.0 in | Wt 204.0 lb

## 2017-08-18 DIAGNOSIS — K5900 Constipation, unspecified: Secondary | ICD-10-CM | POA: Diagnosis not present

## 2017-08-18 NOTE — Progress Notes (Addendum)
Subjective:    Patient ID: Monica Murray, female    DOB: [birth year], 82 y.o.   MRN: [id number]  HPI Here today for f/u. Last seen in January of 2018. Hx of diverticulitis and C-diff.  Hx of hemorrhoids. Surgery in 2018 by Dr. Roxan Hockey for hemorrhoidectomy  09/16/2016 Flex sigmoid: rectal bleeding.  Dr Jenelle Mages. Thrombosed hemorroids. Moderate diverticulosis in the sigmoid colon.     Two weeks ago she had diarrhea. She took some antidiarrhea medication. Since then, she has been basically constipated. She tried to give herself a enema, laxative, and stool softener. Her last BM (good one) was 2 weeks ago.  She did have a small BM yesterd ago. Her appetite is good. She says she has never been constipated in her life.    Her last colonoscopy was in June of 2009 which showed scattered diverticula at the sigmoid colon.  Biopsy: Tubular adenoma.  Review of Systems Past Medical History:  Diagnosis Date  . Chronic constipation   . Chronic diarrhea   . Clostridium difficile infection   . Diabetes (HCC)    since 2014  . Diverticulitis   . Enteritis due to Clostridium difficile 07/16/2016  . Hypertension   . Irritable bowel syndrome   . LLQ pain   . Rectal bleed   . Renal disorder    kidney stone    Past Surgical History:  Procedure Laterality Date  . BREAST LUMPECTOMY Right    x2  . BURN TREATMENT    . COLONOSCOPY  12/06/07   NUR  . FLEXIBLE SIGMOIDOSCOPY N/A 09/15/2016   Procedure: FLEXIBLE SIGMOIDOSCOPY;  Surgeon: West Bali, MD;  Location: [facility];  Service: Endoscopy;  Laterality: N/A;  2:45 pm  . KIDNEY STONE SURGERY    . SIGMOIDOSCOPY  05/24/95   NUR  . TONSILLECTOMY      Allergies  Allergen Reactions  . Macrodantin Nausea Only  . Sulfa Antibiotics Nausea Only    Current Outpatient Medications on File Prior to Visit  Medication Sig Dispense Refill  . aspirin EC 81 MG tablet Take 81 mg by mouth daily.    Marland Kitchen dicyclomine (BENTYL) 10 MG capsule Take 10 mg  by mouth 2 (two) times daily.     Marland Kitchen escitalopram (LEXAPRO) 20 MG tablet Take 20 mg by mouth daily.    . furosemide (LASIX) 20 MG tablet Take 20 mg by mouth 2 (two) times daily.     Marland Kitchen glipiZIDE (GLUCOTROL XL) 2.5 MG 24 hr tablet Take 2.5 mg by mouth daily with breakfast.    . lisinopril (PRINIVIL,ZESTRIL) 5 MG tablet Take 5 mg by mouth daily.    Marland Kitchen loperamide (IMODIUM) 2 MG capsule Take by mouth as needed for diarrhea or loose stools. Reported on 01/07/2016    . metoprolol tartrate (LOPRESSOR) 25 MG tablet Take 25 mg by mouth 2 (two) times daily.    . pravastatin (PRAVACHOL) 80 MG tablet Take 80 mg by mouth at bedtime.     . Probiotic Product ([employer] PROBIOTIC MAXIMUM STRENGTH) CAPS Take 1 capsule by mouth daily.     Current Facility-Administered Medications on File Prior to Visit  Medication Dose Route Frequency Provider Last Rate Last Dose  . sodium phosphate (FLEET) 7-19 GM/118ML enema 2 enema  2 enema Rectal Once Fields, Sandi L, MD            Objective:   Physical Exam Blood pressure (!) 180/90, pulse 60, temperature 98 F (36.7 C), height 5\' 4"  (1.626 m),  weight 204 lb (92.5 kg). Alert and oriented. Skin warm and dry. Oral mucosa is moist.   . Sclera anicteric, conjunctivae is pink. Thyroid not enlarged. No cervical lymphadenopathy. Lungs clear. Heart regular rate and rhythm.  Abdomen is soft. Bowel sounds are positive. No hepatomegaly. No abdominal masses felt. No tenderness.  No edema to lower extremities.  No stool in rectum.          Assessment & Plan:  Constipation. Go to Drug store and get some Mag Citrate.  Call with PR this afternoon

## 2017-08-18 NOTE — Patient Instructions (Signed)
Mag Citrate. Call me when u have a BM

## 2017-08-18 NOTE — Telephone Encounter (Signed)
Patient called stating she took the laxative at 12:45 and it is just now binginning to work at 2:53. Patient states she is having a lot of gas and stomach pain but the pain it not bad. Please advise

## 2017-08-18 NOTE — Telephone Encounter (Signed)
Message left on her answering machine 

## 2017-08-23 ENCOUNTER — Telehealth (INDEPENDENT_AMBULATORY_CARE_PROVIDER_SITE_OTHER): Payer: Self-pay | Admitting: *Deleted

## 2017-08-23 NOTE — Telephone Encounter (Signed)
Message left on answering machine 

## 2017-08-23 NOTE — Telephone Encounter (Signed)
Patient called asking for Monica Murray only to call her

## 2017-08-23 NOTE — Telephone Encounter (Signed)
She says she is not constipated. I advised her not to take anything else for constipation and to call me mid day tomorrow with a PR report. She had 4-5 soft stools today; which were all soft.

## 2017-08-26 NOTE — Telephone Encounter (Signed)
Patient left message wanting Terri to call her ASAP patient stated she is still having problems [phone]

## 2017-08-30 NOTE — Telephone Encounter (Signed)
Patient states she is going to see her PCP

## 2017-09-08 ENCOUNTER — Encounter (HOSPITAL_COMMUNITY): Payer: Self-pay

## 2017-09-08 ENCOUNTER — Ambulatory Visit (HOSPITAL_COMMUNITY)
Admission: RE | Admit: 2017-09-08 | Discharge: 2017-09-08 | Disposition: A | Discharge status: Home / Self Care | Payer: Medicare HMO | Source: Ambulatory Visit | Attending: Family Medicine | Admitting: Family Medicine

## 2017-09-08 DIAGNOSIS — Z1231 Encounter for screening mammogram for malignant neoplasm of breast: Secondary | ICD-10-CM | POA: Diagnosis not present

## 2018-11-09 ENCOUNTER — Other Ambulatory Visit (HOSPITAL_COMMUNITY): Payer: Self-pay | Admitting: Family Medicine

## 2018-11-09 DIAGNOSIS — Z1231 Encounter for screening mammogram for malignant neoplasm of breast: Secondary | ICD-10-CM

## 2019-02-01 ENCOUNTER — Ambulatory Visit (HOSPITAL_COMMUNITY)
Admission: RE | Admit: 2019-02-01 | Discharge: 2019-02-01 | Disposition: A | Discharge status: Home / Self Care | Payer: Medicare HMO | Source: Ambulatory Visit | Attending: Family Medicine | Admitting: Family Medicine

## 2019-02-01 ENCOUNTER — Other Ambulatory Visit: Payer: Self-pay

## 2019-02-01 ENCOUNTER — Other Ambulatory Visit (HOSPITAL_COMMUNITY): Payer: Self-pay | Admitting: Physician Assistant

## 2019-02-01 DIAGNOSIS — Z1231 Encounter for screening mammogram for malignant neoplasm of breast: Secondary | ICD-10-CM | POA: Insufficient documentation

## 2019-02-21 ENCOUNTER — Telehealth: Payer: Self-pay | Admitting: Physician Assistant

## 2019-02-21 NOTE — Telephone Encounter (Signed)
No earlier appointments available.  Patient aware.  Advised she see someone at an urgent care until she could be established as a new patient here.

## 2019-03-02 ENCOUNTER — Telehealth: Payer: Self-pay | Admitting: Physician Assistant

## 2019-03-02 NOTE — Telephone Encounter (Signed)
yes

## 2019-03-03 ENCOUNTER — Ambulatory Visit (INDEPENDENT_AMBULATORY_CARE_PROVIDER_SITE_OTHER): Payer: Medicare HMO | Admitting: Physician Assistant

## 2019-03-03 ENCOUNTER — Other Ambulatory Visit: Payer: Self-pay

## 2019-03-03 ENCOUNTER — Telehealth: Payer: Self-pay | Admitting: Physician Assistant

## 2019-03-03 ENCOUNTER — Encounter: Payer: Self-pay | Admitting: Physician Assistant

## 2019-03-03 DIAGNOSIS — E785 Hyperlipidemia, unspecified: Secondary | ICD-10-CM | POA: Diagnosis not present

## 2019-03-03 DIAGNOSIS — K589 Irritable bowel syndrome without diarrhea: Secondary | ICD-10-CM | POA: Diagnosis not present

## 2019-03-03 DIAGNOSIS — F3341 Major depressive disorder, recurrent, in partial remission: Secondary | ICD-10-CM

## 2019-03-03 DIAGNOSIS — E119 Type 2 diabetes mellitus without complications: Secondary | ICD-10-CM | POA: Diagnosis not present

## 2019-03-03 DIAGNOSIS — I10 Essential (primary) hypertension: Secondary | ICD-10-CM

## 2019-03-03 DIAGNOSIS — F32A Depression, unspecified: Secondary | ICD-10-CM | POA: Insufficient documentation

## 2019-03-03 DIAGNOSIS — F329 Major depressive disorder, single episode, unspecified: Secondary | ICD-10-CM | POA: Insufficient documentation

## 2019-03-03 MED ORDER — GLIPIZIDE ER 2.5 MG PO TB24: 2.5000 mg | ORAL_TABLET | Freq: Every day | ORAL | 1 refills | Status: DC

## 2019-03-03 MED ORDER — ASPIRIN EC 81 MG PO TBEC: 81.0000 mg | DELAYED_RELEASE_TABLET | Freq: Every day | ORAL | Status: AC

## 2019-03-03 MED ORDER — MELOXICAM 15 MG PO TABS: 15.0000 mg | ORAL_TABLET | Freq: Every day | ORAL | 1 refills | Status: DC

## 2019-03-03 MED ORDER — ESCITALOPRAM OXALATE 20 MG PO TABS: 20.0000 mg | ORAL_TABLET | Freq: Every day | ORAL | 1 refills | Status: DC

## 2019-03-03 MED ORDER — DIPHENOXYLATE-ATROPINE 2.5-0.025 MG PO TABS: 1.0000 | ORAL_TABLET | Freq: Four times a day (QID) | ORAL | 5 refills | Status: DC

## 2019-03-03 MED ORDER — FUROSEMIDE 20 MG PO TABS: 20.0000 mg | ORAL_TABLET | Freq: Two times a day (BID) | ORAL | 1 refills | Status: DC

## 2019-03-03 MED ORDER — LOPERAMIDE HCL 2 MG PO CAPS: 2.0000 mg | ORAL_CAPSULE | ORAL | Status: DC | PRN

## 2019-03-03 MED ORDER — LISINOPRIL 5 MG PO TABS: 5.0000 mg | ORAL_TABLET | Freq: Every day | ORAL | 3 refills | Status: AC

## 2019-03-03 MED ORDER — PRAVASTATIN SODIUM 40 MG PO TABS: 80.0000 mg | ORAL_TABLET | Freq: Every day | ORAL | 1 refills | Status: DC

## 2019-03-03 MED ORDER — CVS PROBIOTIC MAXIMUM STRENGTH PO CAPS: 1.0000 | ORAL_CAPSULE | Freq: Every day | ORAL | Status: DC

## 2019-03-03 MED ORDER — METOPROLOL TARTRATE 25 MG PO TABS: 25.0000 mg | ORAL_TABLET | Freq: Two times a day (BID) | ORAL | 1 refills | Status: DC

## 2019-03-03 NOTE — Telephone Encounter (Signed)
Appointment scheduled.

## 2019-03-08 NOTE — Progress Notes (Signed)
Telephone visit  Subjective: ES:[id number] PCP: Remus Loffler, PA-C [id number] L Zeitlin is a 83 y.o. female calls for telephone consult today. Patient provides verbal consent for consult held via phone.  Patient is identified with 2 separate identifiers.  At this time the entire area is on COVID-19 social distancing and stay home orders are in place.  Patient is of higher risk and therefore we are performing this by a virtual method.  Location of patient: home Location of provider: HOME Others present for call: son  This patient is a longstanding patient of Dr. Lysbeth Galas.  She had had a significant third and was hospitalized for quite some time a few years ago.  Afterwards she went to live in [place] with her child that help take care of her.  She is back in the area now.  There were several medications that she used to be out of very soon before the appointment that we had scheduled in October.  Therefore we are going to have a telephone visit to get her established and work on her medications.  I am familiar with this patient from being out of the previous practice.  She does have medical history of hypertension, IBS, history of C. difficile infection, diverticulitis, diabetes type 2 well-controlled Renal insufficiency, urge incontinence, depression.  Overall she states that she is good and stable at this time.  She still sees her specialist.  Refills will be sent as needed.  And we will have her please continue to keep the appointment in a few weeks.   ROS: Per HPI  Allergies  Allergen Reactions  . Macrodantin Nausea Only  . Sulfa Antibiotics Nausea Only   Past Medical History:  Diagnosis Date  . Chronic constipation   . Chronic diarrhea   . Clostridium difficile infection   . Diabetes (HCC)    since 2014  . Diverticulitis   . Enteritis due to Clostridium difficile 07/16/2016  . Hypertension   . Irritable bowel syndrome   . LLQ pain   . Rectal bleed   . Renal disorder     kidney stone    Current Outpatient Medications:  .  diphenoxylate-atropine (LOMOTIL) 2.5-0.025 MG tablet, Take 1 tablet by mouth 4 (four) times daily., Disp: 120 tablet, Rfl: 5 .  aspirin EC 81 MG tablet, Take 1 tablet (81 mg total) by mouth daily., Disp:  , Rfl:  .  escitalopram (LEXAPRO) 20 MG tablet, Take 1 tablet (20 mg total) by mouth daily., Disp: 90 tablet, Rfl: 1 .  furosemide (LASIX) 20 MG tablet, Take 1 tablet (20 mg total) by mouth 2 (two) times daily., Disp: 90 tablet, Rfl: 1 .  glipiZIDE (GLUCOTROL XL) 2.5 MG 24 hr tablet, Take 1 tablet (2.5 mg total) by mouth daily with breakfast., Disp: 90 tablet, Rfl: 1 .  lisinopril (ZESTRIL) 5 MG tablet, Take 1 tablet (5 mg total) by mouth daily., Disp: 90 tablet, Rfl: 3 .  loperamide (IMODIUM) 2 MG capsule, Take 1 capsule (2 mg total) by mouth as needed for diarrhea or loose stools. Reported on 01/07/2016, Disp: 30 capsule, Rfl:  .  meloxicam (MOBIC) 15 MG tablet, Take 1 tablet (15 mg total) by mouth daily., Disp: 90 tablet, Rfl: 1 .  metoprolol tartrate (LOPRESSOR) 25 MG tablet, Take 1 tablet (25 mg total) by mouth 2 (two) times daily., Disp: 180 tablet, Rfl: 1 .  pravastatin (PRAVACHOL) 40 MG tablet, Take 2 tablets (80 mg total) by mouth at bedtime., Disp: 90 tablet, Rfl:  1 .  Probiotic Product ([employer] PROBIOTIC MAXIMUM STRENGTH) CAPS, Take 1 capsule by mouth daily., Disp:  , Rfl:   Current Facility-Administered Medications:  .  sodium phosphate (FLEET) 7-19 GM/118ML enema 2 enema, 2 enema, Rectal, Once, Fields, Darleene CleaverSandi L, MD  Assessment/ Plan: 83 y.o. female   1. Essential hypertension - furosemide (LASIX) 20 MG tablet; Take 1 tablet (20 mg total) by mouth 2 (two) times daily.  Dispense: 90 tablet; Refill: 1 - lisinopril (ZESTRIL) 5 MG tablet; Take 1 tablet (5 mg total) by mouth daily.  Dispense: 90 tablet; Refill: 3 - metoprolol tartrate (LOPRESSOR) 25 MG tablet; Take 1 tablet (25 mg total) by mouth 2 (two) times daily.  Dispense: 180  tablet; Refill: 1  2. Irritable bowel syndrome, unspecified type - diphenoxylate-atropine (LOMOTIL) 2.5-0.025 MG tablet; Take 1 tablet by mouth 4 (four) times daily.  Dispense: 120 tablet; Refill: 5 - loperamide (IMODIUM) 2 MG capsule; Take 1 capsule (2 mg total) by mouth as needed for diarrhea or loose stools. Reported on 01/07/2016  Dispense: 30 capsule - Probiotic Product ([employer] PROBIOTIC MAXIMUM STRENGTH) CAPS; Take 1 capsule by mouth daily.  Dispense:    3. Type 2 diabetes mellitus without complication, without long-term current use of insulin (HCC) - glipiZIDE (GLUCOTROL XL) 2.5 MG 24 hr tablet; Take 1 tablet (2.5 mg total) by mouth daily with breakfast.  Dispense: 90 tablet; Refill: 1 - pravastatin (PRAVACHOL) 40 MG tablet; Take 2 tablets (80 mg total) by mouth at bedtime.  Dispense: 90 tablet; Refill: 1  4. Hyperlipidemia, unspecified hyperlipidemia type - pravastatin (PRAVACHOL) 40 MG tablet; Take 2 tablets (80 mg total) by mouth at bedtime.  Dispense: 90 tablet; Refill: 1  5. Recurrent major depressive disorder, in partial remission (HCC) - escitalopram (LEXAPRO) 20 MG tablet; Take 1 tablet (20 mg total) by mouth daily.  Dispense: 90 tablet; Refill: 1   No follow-ups on file.  Continue all other maintenance medications as listed above.  Start time: 2:04 PM End time: 2:24 PM  Meds ordered this encounter  Medications  . escitalopram (LEXAPRO) 20 MG tablet    Sig: Take 1 tablet (20 mg total) by mouth daily.    Dispense:  90 tablet    Refill:  1    Order Specific Question:   Supervising Provider    Answer:   Raliegh IpGOTTSCHALK, ASHLY M [[id number]]  . glipiZIDE (GLUCOTROL XL) 2.5 MG 24 hr tablet    Sig: Take 1 tablet (2.5 mg total) by mouth daily with breakfast.    Dispense:  90 tablet    Refill:  1    Order Specific Question:   Supervising Provider    Answer:   Raliegh IpGOTTSCHALK, ASHLY M [[id number]]  . furosemide (LASIX) 20 MG tablet    Sig: Take 1 tablet (20 mg total) by mouth 2 (two) times  daily.    Dispense:  90 tablet    Refill:  1    Order Specific Question:   Supervising Provider    Answer:   Raliegh IpGOTTSCHALK, ASHLY M [[id number]][[id number]]  . lisinopril (ZESTRIL) 5 MG tablet    Sig: Take 1 tablet (5 mg total) by mouth daily.    Dispense:  90 tablet    Refill:  3    Order Specific Question:   Supervising Provider    Answer:   Raliegh IpGOTTSCHALK, ASHLY M [[id number]][[id number]]  . pravastatin (PRAVACHOL) 40 MG tablet    Sig: Take 2 tablets (80 mg total) by mouth at bedtime.    Dispense:  90 tablet    Refill:  1    Order Specific Question:   Supervising Provider    Answer:   Janora Norlander [[id number]]  . metoprolol tartrate (LOPRESSOR) 25 MG tablet    Sig: Take 1 tablet (25 mg total) by mouth 2 (two) times daily.    Dispense:  180 tablet    Refill:  1    Order Specific Question:   Supervising Provider    Answer:   Janora Norlander [[id number]]  . meloxicam (MOBIC) 15 MG tablet    Sig: Take 1 tablet (15 mg total) by mouth daily.    Dispense:  90 tablet    Refill:  1    Order Specific Question:   Supervising Provider    Answer:   Janora Norlander [[id number]]  . diphenoxylate-atropine (LOMOTIL) 2.5-0.025 MG tablet    Sig: Take 1 tablet by mouth 4 (four) times daily.    Dispense:  120 tablet    Refill:  5    Order Specific Question:   Supervising Provider    Answer:   Janora Norlander [[id number]]  . loperamide (IMODIUM) 2 MG capsule    Sig: Take 1 capsule (2 mg total) by mouth as needed for diarrhea or loose stools. Reported on 01/07/2016    Dispense:  30 capsule    Order Specific Question:   Supervising Provider    Answer:   Janora Norlander [[id number]]  . Probiotic Product ([employer] PROBIOTIC MAXIMUM STRENGTH) CAPS    Sig: Take 1 capsule by mouth daily.    Dispense:       Order Specific Question:   Supervising Provider    Answer:   Janora Norlander [[id number]]  . aspirin EC 81 MG tablet    Sig: Take 1 tablet (81 mg total) by mouth daily.    Dispense:       Order Specific Question:    Supervising Provider    Answer:   Janora Norlander [[id number]]    Particia Nearing PA-C [place] [phone]

## 2019-03-10 ENCOUNTER — Telehealth: Payer: Self-pay | Admitting: *Deleted

## 2019-03-10 MED ORDER — PRAVASTATIN SODIUM 80 MG PO TABS: 80.0000 mg | ORAL_TABLET | Freq: Every day | ORAL | 1 refills | Status: AC

## 2019-03-10 NOTE — Telephone Encounter (Signed)
Spoke with pt and her current rx from her doctor in [place] is 80mg  pravastatin take 1 daily. Will discontinue the 40mg  rx and send in Pravastatin 80mg  1 PO daily.   Also pt wanted to get her flu shot so scheduled pt with Nurse next week to have her flu shot.

## 2019-03-10 NOTE — Telephone Encounter (Signed)
thanks

## 2019-03-10 NOTE — Telephone Encounter (Signed)
Pravastatin 40mg  Take 2 tabs PO daily exceeds the maximum daily dose of 1 tab per day  Has she tried other medications or has she taken only one in the past daily? Don't have enough info to do the PA.

## 2019-03-10 NOTE — Telephone Encounter (Signed)
I have just gotten this patient patient.  Can you call her and check and see if she actually only takes 1/day and that way the prescription spreads out longer?  And if this is the case let her know that the insurance is not letting us do that anymore and we will have to change it to 1 daily.  If this is not the case we may need to change it to 80 mg 1 daily

## 2019-03-13 ENCOUNTER — Other Ambulatory Visit: Payer: Self-pay

## 2019-03-14 ENCOUNTER — Ambulatory Visit (INDEPENDENT_AMBULATORY_CARE_PROVIDER_SITE_OTHER): Payer: Medicare HMO

## 2019-03-14 DIAGNOSIS — Z23 Encounter for immunization: Secondary | ICD-10-CM | POA: Diagnosis not present

## 2019-03-14 DIAGNOSIS — Z029 Encounter for administrative examinations, unspecified: Secondary | ICD-10-CM

## 2019-03-14 NOTE — Progress Notes (Signed)
e

## 2019-04-28 ENCOUNTER — Other Ambulatory Visit: Payer: Self-pay

## 2019-05-01 ENCOUNTER — Ambulatory Visit: Payer: Medicare HMO | Admitting: Physician Assistant

## 2019-05-01 ENCOUNTER — Encounter: Payer: Self-pay | Admitting: Physician Assistant

## 2019-05-01 ENCOUNTER — Other Ambulatory Visit: Payer: Self-pay

## 2019-05-01 VITALS — BP 131/69 | HR 54 | Temp 96.3°F | Ht 64.0 in | Wt 189.8 lb

## 2019-05-01 DIAGNOSIS — E119 Type 2 diabetes mellitus without complications: Secondary | ICD-10-CM | POA: Diagnosis not present

## 2019-05-01 DIAGNOSIS — L309 Dermatitis, unspecified: Secondary | ICD-10-CM

## 2019-05-01 DIAGNOSIS — R300 Dysuria: Secondary | ICD-10-CM

## 2019-05-01 DIAGNOSIS — N3001 Acute cystitis with hematuria: Secondary | ICD-10-CM | POA: Diagnosis not present

## 2019-05-01 LAB — URINALYSIS, COMPLETE
Bilirubin, UA: NEGATIVE
Glucose, UA: NEGATIVE
Ketones, UA: NEGATIVE
Nitrite, UA: NEGATIVE
Protein,UA: NEGATIVE
Specific Gravity, UA: 1.015 (ref 1.005–1.030)
Urobilinogen, Ur: 0.2 mg/dL (ref 0.2–1.0)
pH, UA: 5.5 (ref 5.0–7.5)

## 2019-05-01 LAB — MICROSCOPIC EXAMINATION

## 2019-05-01 LAB — BAYER DCA HB A1C WAIVED: HB A1C (BAYER DCA - WAIVED): 5.6 % (ref <7.0)

## 2019-05-01 MED ORDER — CIPROFLOXACIN HCL 250 MG PO TABS: 250.0000 mg | ORAL_TABLET | Freq: Two times a day (BID) | ORAL | 0 refills | Status: DC

## 2019-05-01 MED ORDER — HYDROCORTISONE 0.5 % EX CREA: 1.0000 "application " | TOPICAL_CREAM | Freq: Two times a day (BID) | CUTANEOUS | 0 refills | Status: DC

## 2019-05-01 MED ORDER — FLUCONAZOLE 150 MG PO TABS: ORAL_TABLET | ORAL | 0 refills | Status: DC
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
## 2019-05-01 NOTE — Patient Instructions (Signed)
Gas-X Gaviscon    over the little for couple weeks at 3 a day to go to a day, at the same time titrate up on Imodium.

## 2019-05-02 LAB — MICROALBUMIN / CREATININE URINE RATIO
Creatinine, Urine: 41.1 mg/dL
Microalb/Creat Ratio: 52 mg/g creat — ABNORMAL HIGH (ref 0–29)
Microalbumin, Urine: 21.5 ug/mL

## 2019-05-03 LAB — URINE CULTURE

## 2019-05-04 ENCOUNTER — Other Ambulatory Visit: Payer: Self-pay | Admitting: Physician Assistant

## 2019-05-04 MED ORDER — AMOXICILLIN-POT CLAVULANATE 875-125 MG PO TABS: 1.0000 | ORAL_TABLET | Freq: Two times a day (BID) | ORAL | 0 refills | Status: DC

## 2019-05-05 ENCOUNTER — Telehealth: Payer: Self-pay | Admitting: Physician Assistant

## 2019-05-05 ENCOUNTER — Other Ambulatory Visit: Payer: Self-pay | Admitting: Physician Assistant

## 2019-05-05 MED ORDER — AMOXICILLIN-POT CLAVULANATE 875-125 MG PO TABS: 1.0000 | ORAL_TABLET | Freq: Two times a day (BID) | ORAL | 0 refills | Status: DC

## 2019-05-05 NOTE — Telephone Encounter (Signed)
[place] and cancelled rx and sent Augmentin to [facility] per pt request and pt is aware.

## 2019-05-24 ENCOUNTER — Ambulatory Visit (INDEPENDENT_AMBULATORY_CARE_PROVIDER_SITE_OTHER): Payer: Medicare HMO | Admitting: Family Medicine

## 2019-05-24 ENCOUNTER — Encounter: Payer: Self-pay | Admitting: Family Medicine

## 2019-05-24 DIAGNOSIS — N3001 Acute cystitis with hematuria: Secondary | ICD-10-CM

## 2019-05-24 DIAGNOSIS — R110 Nausea: Secondary | ICD-10-CM | POA: Diagnosis not present

## 2019-05-24 DIAGNOSIS — R8290 Unspecified abnormal findings in urine: Secondary | ICD-10-CM

## 2019-05-24 LAB — URINALYSIS, COMPLETE
Bilirubin, UA: NEGATIVE
Glucose, UA: NEGATIVE
Ketones, UA: NEGATIVE
Nitrite, UA: NEGATIVE
Protein,UA: NEGATIVE
Specific Gravity, UA: 1.015 (ref 1.005–1.030)
Urobilinogen, Ur: 0.2 mg/dL (ref 0.2–1.0)
pH, UA: 5.5 (ref 5.0–7.5)

## 2019-05-24 LAB — MICROSCOPIC EXAMINATION: Renal Epithel, UA: NONE SEEN /hpf

## 2019-05-24 MED ORDER — CEPHALEXIN 500 MG PO CAPS: 500.0000 mg | ORAL_CAPSULE | Freq: Two times a day (BID) | ORAL | 0 refills | Status: AC

## 2019-05-24 NOTE — Progress Notes (Signed)
Virtual Visit via Telephone Note  I connected with Monica Murray on 05/24/19 at 10:10 AM by telephone and verified that I am speaking with the correct person using two identifiers. Monica Murray is currently located at home and nobody is currently with her during this visit. The provider, Gwenlyn Fudge, FNP is located in their office at time of visit.  I discussed the limitations, risks, security and privacy concerns of performing an evaluation and management service by telephone and the availability of in person appointments. I also discussed with the patient that there may be a patient responsible charge related to this service. The patient expressed understanding and agreed to proceed.  Subjective: PCP: Remus Loffler, PA-C  Chief Complaint  Patient presents with  . Urinary Tract Infection   Urinary Tract Infection: Patient complains of abnormal smelling urine and nausea Patient also complains of diarrhea and just not feeling well. Patient denies dysuria but states she never has it anymore with her UTIs. Patient does have a history of recurrent UTI.  Patient does not have a history of pyelonephritis. Recent urine culture last month resistant to Cipro, she was therefore treated with Amoxicillin which made her nauseated. She states she was supposed to be given Augmentin but the pharmacy gave her Amoxicillin. She started itching and and her skin turned a pink color. She continued the medication despite this.    ROS: Per HPI  Current Outpatient Medications:  .  amoxicillin-clavulanate (AUGMENTIN) 875-125 MG tablet, Take 1 tablet by mouth 2 (two) times daily., Disp: 20 tablet, Rfl: 0 .  aspirin EC 81 MG tablet, Take 1 tablet (81 mg total) by mouth daily., Disp:  , Rfl:  .  diphenoxylate-atropine (LOMOTIL) 2.5-0.025 MG tablet, Take 1 tablet by mouth 4 (four) times daily., Disp: 120 tablet, Rfl: 5 .  escitalopram (LEXAPRO) 20 MG tablet, Take 1 tablet (20 mg total) by mouth daily.,  Disp: 90 tablet, Rfl: 1 .  fluconazole (DIFLUCAN) 150 MG tablet, 1 po q week x 4 weeks, Disp: 4 tablet, Rfl: 0 .  furosemide (LASIX) 20 MG tablet, Take 1 tablet (20 mg total) by mouth 2 (two) times daily., Disp: 90 tablet, Rfl: 1 .  glipiZIDE (GLUCOTROL XL) 2.5 MG 24 hr tablet, Take 1 tablet (2.5 mg total) by mouth daily with breakfast., Disp: 90 tablet, Rfl: 1 .  hydrocortisone cream 0.5 %, Apply 1 application topically 2 (two) times daily., Disp: 30 g, Rfl: 0 .  lisinopril (ZESTRIL) 5 MG tablet, Take 1 tablet (5 mg total) by mouth daily., Disp: 90 tablet, Rfl: 3 .  loperamide (IMODIUM) 2 MG capsule, Take 1 capsule (2 mg total) by mouth as needed for diarrhea or loose stools. Reported on 01/07/2016, Disp: 30 capsule, Rfl:  .  meloxicam (MOBIC) 15 MG tablet, Take 1 tablet (15 mg total) by mouth daily., Disp: 90 tablet, Rfl: 1 .  metoprolol tartrate (LOPRESSOR) 25 MG tablet, Take 1 tablet (25 mg total) by mouth 2 (two) times daily., Disp: 180 tablet, Rfl: 1 .  pravastatin (PRAVACHOL) 80 MG tablet, Take 1 tablet (80 mg total) by mouth daily., Disp: 90 tablet, Rfl: 1 .  Probiotic Product ([employer] PROBIOTIC MAXIMUM STRENGTH) CAPS, Take 1 capsule by mouth daily., Disp:  , Rfl:   Allergies  Allergen Reactions  . Macrodantin Nausea Only  . Sulfa Antibiotics Nausea Only   Past Medical History:  Diagnosis Date  . Chronic constipation   . Chronic diarrhea   . Clostridium difficile infection   .  Diabetes ([place])    since 2014  . Diverticulitis   . Enteritis due to Clostridium difficile 07/16/2016  . Hypertension   . Irritable bowel syndrome   . LLQ pain   . Rectal bleed   . Renal disorder    kidney stone    Observations/Objective: A&O  No respiratory distress or wheezing audible over the phone Mood, judgement, and thought processes all WNL  Assessment and Plan: 1. Acute cystitis with hematuria - cephALEXin (KEFLEX) 500 MG capsule; Take 1 capsule (500 mg total) by mouth 2 (two) times daily for 7  days.  Dispense: 14 capsule; Refill: 0 - urinalysis- dip and micro; Future - Urine culture; Future  2-3. Abnormal urine odor/Nausea - urinalysis- dip and micro. Urine dipstick shows positive for RBC's and positive for leukocytes.  Micro exam: 11-30 WBC's per HPF, 3-10 RBC's per HPF and few bacteria. - Urine culture   Follow Up Instructions:  I discussed the assessment and treatment plan with the patient. The patient was provided an opportunity to ask questions and all were answered. The patient agreed with the plan and demonstrated an understanding of the instructions.   The patient was advised to call back or seek an in-person evaluation if the symptoms worsen or if the condition fails to improve as anticipated.  The above assessment and management plan was discussed with the patient. The patient verbalized understanding of and has agreed to the management plan. Patient is aware to call the clinic if symptoms persist or worsen. Patient is aware when to return to the clinic for a follow-up visit. Patient educated on when it is appropriate to go to the emergency department.   Time call ended: 10:18 AM  I provided 20 minutes of non-face-to-face time during this encounter.  Hendricks Limes, MSN, APRN, FNP-C [facility] 05/24/19

## 2019-05-25 LAB — URINE CULTURE

## 2019-06-27 ENCOUNTER — Other Ambulatory Visit: Payer: Self-pay

## 2019-06-27 ENCOUNTER — Other Ambulatory Visit: Payer: Medicare HMO

## 2019-06-27 DIAGNOSIS — N3001 Acute cystitis with hematuria: Secondary | ICD-10-CM

## 2019-06-29 LAB — URINE CULTURE

## 2019-08-01 ENCOUNTER — Ambulatory Visit: Payer: Medicare HMO | Admitting: Physician Assistant

## 2019-08-15 ENCOUNTER — Ambulatory Visit (INDEPENDENT_AMBULATORY_CARE_PROVIDER_SITE_OTHER): Payer: Medicare HMO | Admitting: *Deleted

## 2019-08-15 DIAGNOSIS — Z0000 Encounter for general adult medical examination without abnormal findings: Secondary | ICD-10-CM

## 2019-08-15 NOTE — Progress Notes (Signed)
MEDICARE ANNUAL WELLNESS VISIT  08/15/2019  Telephone Visit Disclaimer This Medicare AWV was conducted by telephone due to national recommendations for restrictions regarding the COVID-19 Pandemic (e.g. social distancing).  I verified, using two identifiers, that I am speaking with Monica Murray or their authorized healthcare agent. I discussed the limitations, risks, security, and privacy concerns of performing an evaluation and management service by telephone and the potential availability of an in-person appointment in the future. The patient expressed understanding and agreed to proceed.   Subjective:  Monica Murray is a 84 y.o. female patient of Remus Loffler, PA-C who had a Medicare Annual Wellness Visit today via telephone. 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 She has one son. She reports that she is socially active and does interact with friends/family regularly. She is not physically active but does enjoy walking weather permitting. She also enjoys playing cards and watching tv.  Patient Care Team: Caryl Never as PCP - General (Physician Assistant) Corbin Ade, MD as Consulting Physician (Gastroenterology)  Advanced Directives 08/15/2019 03/25/2017 09/15/2016 04/03/2016 08/16/2014 05/17/2014  Does Patient Have a Medical Advance Directive? Yes No No No No No  Type of Advance Directive Healthcare Power of Attorney - - - - -  Does patient want to make changes to medical advance directive? No - Patient declined - - - - -  Would patient like information on creating a medical advance directive? - No - Patient declined No - Patient declined No - patient declined information - -    Hospital Utilization Over the Past 12 Months: # of hospitalizations or ER visits: 0 # of surgeries: 0  Review of Systems    Patient reports that her overall health is better compared to last year.  History obtained from the patient and patient chart.    Patient Reported Readings (BP, Pulse, CBG, Weight, etc) none  Pain Assessment Pain : 0-10 Pain Score: 4  Pain Location: Back Pain Orientation: Mid Pain Radiating Towards: kidney Pain Descriptors / Indicators: Dull Pain Onset: Yesterday Pain Frequency: Rarely Pain Relieving Factors: tylenol Effect of Pain on Daily Activities: little  Pain Relieving Factors: tylenol  Current Medications & Allergies (verified) Allergies as of 08/15/2019      Reactions   Macrodantin Nausea Only   Sulfa Antibiotics Nausea Only      Medication List       Accurate as of August 15, 2019  1:59 PM. If you have any questions, ask your nurse or doctor.        aspirin EC 81 MG tablet Take 1 tablet (81 mg total) by mouth daily.   [employer] Probiotic Maximum Strength Caps Take 1 capsule by mouth daily.   desonide 0.05 % cream Commonly known as: DESOWEN Apply 1 application topically 2 (two) times daily.   diphenoxylate-atropine 2.5-0.025 MG tablet Commonly known as: LOMOTIL Take 1 tablet by mouth 4 (four) times daily.   escitalopram 20 MG tablet Commonly known as: LEXAPRO Take 1 tablet (20 mg total) by mouth daily.   furosemide 20 MG tablet Commonly known as: LASIX Take 1 tablet (20 mg total) by mouth 2 (two) times daily.   glipiZIDE 2.5 MG 24 hr tablet Commonly known as: GLUCOTROL XL Take 1 tablet (2.5 mg total) by mouth daily with breakfast.   hydrocortisone cream 0.5 % Apply 1 application topically 2 (two) times daily.   lisinopril 5 MG tablet Commonly known as: ZESTRIL Take 1  tablet (5 mg total) by mouth daily.   loperamide 2 MG capsule Commonly known as: IMODIUM Take 1 capsule (2 mg total) by mouth as needed for diarrhea or loose stools. Reported on 01/07/2016   meloxicam 15 MG tablet Commonly known as: MOBIC Take 1 tablet (15 mg total) by mouth daily.   metoprolol tartrate 25 MG tablet Commonly known as: LOPRESSOR Take 1 tablet (25 mg total) by mouth 2 (two) times daily.    pravastatin 80 MG tablet Commonly known as: PRAVACHOL Take 1 tablet (80 mg total) by mouth daily. What changed: how much to take       History (reviewed): Past Medical History:  Diagnosis Date  . Chronic constipation   . Chronic diarrhea   . Clostridium difficile infection   . Diabetes (HCC)    since 2014  . Diverticulitis   . Enteritis due to Clostridium difficile 07/16/2016  . Hypertension   . Irritable bowel syndrome   . LLQ pain   . Rectal bleed   . Renal disorder    kidney stone   Past Surgical History:  Procedure Laterality Date  . BREAST BIOPSY    . BURN TREATMENT    . COLONOSCOPY  12/06/07   NUR  . FLEXIBLE SIGMOIDOSCOPY N/A 09/15/2016   Procedure: FLEXIBLE SIGMOIDOSCOPY;  Surgeon: West Bali, MD;  Location: [facility];  Service: Endoscopy;  Laterality: N/A;  2:45 pm  . hemorroid surgery    . KIDNEY STONE SURGERY    . SIGMOIDOSCOPY  05/24/95   NUR  . TONSILLECTOMY     Family History  Problem Relation Age of Onset  . Breast cancer Sister   . Breast cancer Mother   . Breast cancer Other    Social History   Socioeconomic History  . Marital status: Widowed    Spouse name: Not on file  . Number of children: Not on file  . Years of education: Not on file  . Highest education level: 12th grade  Occupational History  . Occupation: retired    Comment: Agricultural engineer of social services  Tobacco Use  . Smoking status: Never Smoker  . Smokeless tobacco: Never Used  Substance and Sexual Activity  . Alcohol use: No  . Drug use: No  . Sexual activity: Never  Other Topics Concern  . Not on file  Social History Narrative   Enjoys playing cards and watching tv.    Social Determinants of Health   Financial Resource Strain:   . Difficulty of Paying Living Expenses: Not on file  Food Insecurity:   . Worried About Programme researcher, broadcasting/film/video in the Last Year: Not on file  . Ran Out of Food in the Last Year: Not on file  Transportation Needs:   . Lack of  Transportation (Medical): Not on file  . Lack of Transportation (Non-Medical): Not on file  Physical Activity:   . Days of Exercise per Week: Not on file  . Minutes of Exercise per Session: Not on file  Stress:   . Feeling of Stress : Not on file  Social Connections:   . Frequency of Communication with Friends and Family: Not on file  . Frequency of Social Gatherings with Friends and Family: Not on file  . Attends Religious Services: Not on file  . Active Member of Clubs or Organizations: Not on file  . Attends Banker Meetings: Not on file  . Marital Status: Not on file    Activities of Daily Living In your  present state of health, do you have any difficulty performing the following activities: 08/15/2019  Hearing? Y  Comment some difficulty hearing  Vision? N  Difficulty concentrating or making decisions? N  Walking or climbing stairs? Y  Comment difficulty climbing stairs  Dressing or bathing? N  Doing errands, shopping? N  Preparing Food and eating ? N  Using the Toilet? N  In the past six months, have you accidently leaked urine? Y  Comment wears depends all the time  Do you have problems with loss of bowel control? Y  Comment wears depends all the time, unpredictable diarrhea  Managing your Medications? N  Managing your Finances? N  Housekeeping or managing your Housekeeping? N  Some recent data might be hidden    Patient Education/ Literacy How often do you need to have someone help you when you read instructions, pamphlets, or other written materials from your doctor or pharmacy?: 1 - Never What is the last grade level you completed in school?: 12  Exercise Current Exercise Habits: The patient does not participate in regular exercise at present, Exercise limited by: None identified  Diet Patient reports consuming 2 meals a day and 1 snack(s) a day Patient reports that her primary diet is: Regular Patient reports that she does have regular access to  food.   Depression Screen PHQ 2/9 Scores 08/15/2019 05/01/2019  PHQ - 2 Score 0 2  PHQ- 9 Score 10 3     Fall Risk Fall Risk  08/15/2019 08/15/2019 05/01/2019  Falls in the past year? 0 0 1  Number falls in past yr: - - 0  Injury with Fall? - - 0  Follow up - - Falls prevention discussed     Objective:  Monica Murray seemed alert and oriented and she participated appropriately during our telephone visit.  Blood Pressure Weight BMI  BP Readings from Last 3 Encounters:  05/01/19 131/69  08/18/17 (!) 180/90  03/25/17 (!) 108/52   Wt Readings from Last 3 Encounters:  05/01/19 189 lb 12.8 oz (86.1 kg)  08/18/17 204 lb (92.5 kg)  03/25/17 200 lb (90.7 kg)   BMI Readings from Last 1 Encounters:  05/01/19 32.58 kg/m    *Unable to obtain current vital signs, weight, and BMI due to telephone visit type  Hearing/Vision  . Tezra did not seem to have difficulty with hearing/understanding during the telephone conversation . Reports that she has had a formal eye exam by an eye care professional within the past year . Reports that she has not had a formal hearing evaluation within the past year *Unable to fully assess hearing and vision during telephone visit type  Cognitive Function: 6CIT Screen 08/15/2019  What Year? 0 points  What month? 0 points  What time? 0 points  Count back from 20 0 points  Months in reverse 2 points  Repeat phrase 0 points  Total Score 2   (Normal:0-7, Significant for Dysfunction: >8)  Normal Cognitive Function Screening: Yes   Immunization & Health Maintenance Record Immunization History  Administered Date(s) Administered  . Fluad Quad(high Dose 65+) 03/14/2019  . Influenza, High Dose Seasonal PF 04/29/2016  . Pneumococcal Conjugate-13 02/12/2015  . Pneumococcal Polysaccharide-23 08/04/2016  . Tdap 09/19/2012, 09/19/2012    Health Maintenance  Topic Date Due  . DEXA SCAN  07/29/2002  . HEMOGLOBIN A1C  10/29/2019  . FOOT EXAM  04/30/2020   . OPHTHALMOLOGY EXAM  05/22/2020  . TETANUS/TDAP  09/20/2022  . INFLUENZA VACCINE  Completed  .  PNA vac Low Risk Adult  Completed       Assessment  This is a routine wellness examination for Monica Murray.  Health Maintenance: Due or Overdue Health Maintenance Due  Topic Date Due  . DEXA SCAN  07/29/2002    Monica Murray does not need a referral for Community Assistance: Care Management:   no Social Work:    no Prescription Assistance:  no Nutrition/Diabetes Education:  no   Plan:  Personalized Goals Goals Addressed   None    Personalized Health Maintenance & Screening Recommendations  Bone densitometry screening  Lung Cancer Screening Recommended: no (Low Dose CT Chest recommended if Age 84-80 years, 30 pack-year currently smoking OR have quit w/in past 15 years) Hepatitis C Screening recommended: no HIV Screening recommended: no  Advanced Directives: Written information was not prepared per patient's request.  Referrals & Orders No orders of the defined types were placed in this encounter.   Follow-up Plan . Follow-up with Remus Loffler, PA-C as planned . Schedule Bone Density Scan .    I have personally reviewed and noted the following in the patient's chart:   . Medical and social history . Use of alcohol, tobacco or illicit drugs  . Current medications and supplements . Functional ability and status . Nutritional status . Physical activity . Advanced directives . List of other physicians . Hospitalizations, surgeries, and ER visits in previous 12 months . Vitals . Screenings to include cognitive, depression, and falls . Referrals and appointments  In addition, I have reviewed and discussed with Monica Murray certain preventive protocols, quality metrics, and best practice recommendations. A written personalized care plan for preventive services as well as general preventive health recommendations is available and can be mailed to the  patient at her request.      Adella Hare, LPN  [id number]

## 2019-08-16 ENCOUNTER — Encounter: Payer: Self-pay | Admitting: Physician Assistant

## 2019-08-16 ENCOUNTER — Ambulatory Visit (INDEPENDENT_AMBULATORY_CARE_PROVIDER_SITE_OTHER): Payer: Medicare HMO | Admitting: Physician Assistant

## 2019-08-16 DIAGNOSIS — I10 Essential (primary) hypertension: Secondary | ICD-10-CM | POA: Diagnosis not present

## 2019-08-16 DIAGNOSIS — E785 Hyperlipidemia, unspecified: Secondary | ICD-10-CM

## 2019-08-16 DIAGNOSIS — F3341 Major depressive disorder, recurrent, in partial remission: Secondary | ICD-10-CM | POA: Diagnosis not present

## 2019-08-16 DIAGNOSIS — E119 Type 2 diabetes mellitus without complications: Secondary | ICD-10-CM | POA: Diagnosis not present

## 2019-08-16 DIAGNOSIS — M8580 Other specified disorders of bone density and structure, unspecified site: Secondary | ICD-10-CM

## 2019-08-16 DIAGNOSIS — Z0000 Encounter for general adult medical examination without abnormal findings: Secondary | ICD-10-CM

## 2019-08-16 MED ORDER — GLIPIZIDE ER 2.5 MG PO TB24: 2.5000 mg | ORAL_TABLET | Freq: Every day | ORAL | 1 refills | Status: AC

## 2019-08-16 MED ORDER — METOPROLOL TARTRATE 25 MG PO TABS: 25.0000 mg | ORAL_TABLET | Freq: Two times a day (BID) | ORAL | 1 refills | Status: AC

## 2019-08-16 MED ORDER — FUROSEMIDE 20 MG PO TABS: 20.0000 mg | ORAL_TABLET | Freq: Two times a day (BID) | ORAL | 1 refills | Status: DC

## 2019-08-16 MED ORDER — MELOXICAM 15 MG PO TABS: 15.0000 mg | ORAL_TABLET | Freq: Every day | ORAL | 1 refills | Status: DC

## 2019-08-16 MED ORDER — ESCITALOPRAM OXALATE 20 MG PO TABS: 20.0000 mg | ORAL_TABLET | Freq: Every day | ORAL | 1 refills | Status: AC

## 2019-08-16 NOTE — Progress Notes (Signed)
Telephone visit  Subjective: CC: Recheck on chronic medications and conditions PCP: Terald Sleeper, PA-C Monica Murray is a 84 y.o. female calls for telephone consult today. Patient provides verbal consent for consult held via phone.  Patient is identified with 2 separate identifiers.  At this time the entire area is on COVID-19 social distancing and stay home orders are in place.  Patient is of higher risk and therefore we are performing this by a virtual method.  Location of patient: Home Location of provider: HOME Others present for call: None  Patient is having a visit for her chronic medical conditions.  She has had the dermatology leave a couple lesions.  She has one on her back that is not healing very well.  It is still near the bottom of her bra.  I encouraged her to get back in touch with dermatology.  They had said it was precancerous.  But it is not healing very well.  Patient also has hypertension, diabetes, IBS, hyperlipidemia.  She does need some refills to be sent on her medication.  She will be having labs in the near future.  An order has been placed.  She had her first Covid vaccine on 2013 2021.  She states for couple days her glucose did go up and she felt a little warm and flushed but that has resolved.  She will be having her second Covid vaccine on 09/02/2019  ROS: Per HPI  Allergies  Allergen Reactions  . Macrodantin Nausea Only  . Sulfa Antibiotics Nausea Only   Past Medical History:  Diagnosis Date  . Chronic constipation   . Chronic diarrhea   . Clostridium difficile infection   . Diabetes ([place])    since 2014  . Diverticulitis   . Enteritis due to Clostridium difficile 07/16/2016  . Hypertension   . Irritable bowel syndrome   . LLQ pain   . Rectal bleed   . Renal disorder    kidney stone    Current Outpatient Medications:  .  aspirin EC 81 MG tablet, Take 1 tablet (81 mg total) by mouth daily., Disp:  , Rfl:  .  desonide  (DESOWEN) 0.05 % cream, Apply 1 application topically 2 (two) times daily., Disp: , Rfl:  .  diphenoxylate-atropine (LOMOTIL) 2.5-0.025 MG tablet, Take 1 tablet by mouth 4 (four) times daily., Disp: 120 tablet, Rfl: 5 .  escitalopram (LEXAPRO) 20 MG tablet, Take 1 tablet (20 mg total) by mouth daily., Disp: 90 tablet, Rfl: 1 .  furosemide (LASIX) 20 MG tablet, Take 1 tablet (20 mg total) by mouth 2 (two) times daily., Disp: 90 tablet, Rfl: 1 .  glipiZIDE (GLUCOTROL XL) 2.5 MG 24 hr tablet, Take 1 tablet (2.5 mg total) by mouth daily with breakfast., Disp: 90 tablet, Rfl: 1 .  hydrocortisone cream 0.5 %, Apply 1 application topically 2 (two) times daily., Disp: 30 g, Rfl: 0 .  lisinopril (ZESTRIL) 5 MG tablet, Take 1 tablet (5 mg total) by mouth daily., Disp: 90 tablet, Rfl: 3 .  loperamide (IMODIUM) 2 MG capsule, Take 1 capsule (2 mg total) by mouth as needed for diarrhea or loose stools. Reported on 01/07/2016, Disp: 30 capsule, Rfl:  .  meloxicam (MOBIC) 15 MG tablet, Take 1 tablet (15 mg total) by mouth daily., Disp: 90 tablet, Rfl: 1 .  metoprolol tartrate (LOPRESSOR) 25 MG tablet, Take 1 tablet (25 mg total) by mouth 2 (two) times daily., Disp: 180 tablet, Rfl: 1 .  pravastatin (PRAVACHOL) 80 MG tablet, Take 1 tablet (80 mg total) by mouth daily. (Patient taking differently: Take 40 mg by mouth daily. ), Disp: 90 tablet, Rfl: 1 .  Probiotic Product ([employer] PROBIOTIC MAXIMUM STRENGTH) CAPS, Take 1 capsule by mouth daily., Disp:  , Rfl:   Assessment/ Plan: 84 y.o. female   1. Type 2 diabetes mellitus without complication, without long-term current use of insulin (HCC) - glipiZIDE (GLUCOTROL XL) 2.5 MG 24 hr tablet; Take 1 tablet (2.5 mg total) by mouth daily with breakfast.  Dispense: 90 tablet; Refill: 1 - CBC with Differential/Platelet; Future - CMP14+EGFR; Future - Lipid panel; Future - Microalbumin / creatinine urine ratio; Future - Bayer DCA Hb A1c Waived; Future  2. Recurrent major  depressive disorder, in partial remission (HCC) - escitalopram (LEXAPRO) 20 MG tablet; Take 1 tablet (20 mg total) by mouth daily.  Dispense: 90 tablet; Refill: 1  3. Essential hypertension - furosemide (LASIX) 20 MG tablet; Take 1 tablet (20 mg total) by mouth 2 (two) times daily.  Dispense: 90 tablet; Refill: 1 - metoprolol tartrate (LOPRESSOR) 25 MG tablet; Take 1 tablet (25 mg total) by mouth 2 (two) times daily.  Dispense: 180 tablet; Refill: 1 - CBC with Differential/Platelet; Future - CMP14+EGFR; Future - Lipid panel; Future - Microalbumin / creatinine urine ratio; Future  4. Hyperlipidemia, unspecified hyperlipidemia type - CBC with Differential/Platelet; Future - CMP14+EGFR; Future - Lipid panel; Future  5. Well adult exam - CBC with Differential/Platelet; Future - CMP14+EGFR; Future - Lipid panel; Future - Bayer DCA Hb A1c Waived; Future  6. Osteopenia, unspecified location do not happen as do things only when there would - DG [facility] DEXA; Future   Return in about 6 months (around 02/13/2020).  Continue all other maintenance medications as listed above.  Start time: 8:37 AM End time: 8:50 AM  Meds ordered this encounter  Medications  . glipiZIDE (GLUCOTROL XL) 2.5 MG 24 hr tablet    Sig: Take 1 tablet (2.5 mg total) by mouth daily with breakfast.    Dispense:  90 tablet    Refill:  1    Order Specific Question:   Supervising Provider    Answer:   Janora Norlander [[id number]]  . escitalopram (LEXAPRO) 20 MG tablet    Sig: Take 1 tablet (20 mg total) by mouth daily.    Dispense:  90 tablet    Refill:  1    Order Specific Question:   Supervising Provider    Answer:   Janora Norlander [[id number]]  . furosemide (LASIX) 20 MG tablet    Sig: Take 1 tablet (20 mg total) by mouth 2 (two) times daily.    Dispense:  90 tablet    Refill:  1    Order Specific Question:   Supervising Provider    Answer:   Janora Norlander [[id number]]  . meloxicam (MOBIC) 15 MG tablet     Sig: Take 1 tablet (15 mg total) by mouth daily.    Dispense:  90 tablet    Refill:  1    Order Specific Question:   Supervising Provider    Answer:   Janora Norlander [[id number]]  . metoprolol tartrate (LOPRESSOR) 25 MG tablet    Sig: Take 1 tablet (25 mg total) by mouth 2 (two) times daily.    Dispense:  180 tablet    Refill:  1    Order Specific Question:   Supervising Provider    Answer:   Janora Norlander [  [id number]]    Particia Nearing PA-C [place] [phone]

## 2019-08-25 ENCOUNTER — Telehealth: Payer: Self-pay

## 2019-08-25 ENCOUNTER — Telehealth: Payer: Self-pay | Admitting: Physician Assistant

## 2019-08-25 ENCOUNTER — Other Ambulatory Visit: Payer: Self-pay | Admitting: Physician Assistant

## 2019-08-25 DIAGNOSIS — I10 Essential (primary) hypertension: Secondary | ICD-10-CM

## 2019-08-25 NOTE — Telephone Encounter (Signed)
Called patient to set up appointment for DEXA - unable to reach patient.

## 2019-09-13 ENCOUNTER — Telehealth: Payer: Self-pay | Admitting: Physician Assistant

## 2019-09-13 NOTE — Telephone Encounter (Signed)
I dont

## 2019-09-13 NOTE — Telephone Encounter (Signed)
I do not know why the Lexapro would be so expensive for her, we could make the transfer but it is going to affect her someone with transfer from the 1 to the other, on good Rx 30-day supply of Lexapro is $15 and sertraline is $9, ask her how much as it is costing her because it is going to be somewhat of a transfer of medications and getting used to it.

## 2019-09-14 NOTE — Telephone Encounter (Signed)
Patient aware - states she wants to stay on lexapro and will look up good rx. Patient also aware she needs to establish with another provider.

## 2020-01-24 ENCOUNTER — Other Ambulatory Visit (HOSPITAL_COMMUNITY): Payer: Self-pay | Admitting: Internal Medicine

## 2020-01-24 DIAGNOSIS — Z1231 Encounter for screening mammogram for malignant neoplasm of breast: Secondary | ICD-10-CM

## 2020-02-05 ENCOUNTER — Other Ambulatory Visit: Payer: Self-pay

## 2020-02-05 ENCOUNTER — Ambulatory Visit (HOSPITAL_COMMUNITY)
Admission: RE | Admit: 2020-02-05 | Discharge: 2020-02-05 | Disposition: A | Discharge status: Home / Self Care | Payer: Medicare HMO | Source: Ambulatory Visit | Attending: Internal Medicine | Admitting: Internal Medicine

## 2020-02-05 DIAGNOSIS — Z1231 Encounter for screening mammogram for malignant neoplasm of breast: Secondary | ICD-10-CM | POA: Insufficient documentation

## 2020-06-10 ENCOUNTER — Other Ambulatory Visit: Payer: Self-pay | Admitting: Nephrology

## 2020-06-10 DIAGNOSIS — N182 Chronic kidney disease, stage 2 (mild): Secondary | ICD-10-CM

## 2020-06-19 ENCOUNTER — Other Ambulatory Visit: Payer: Self-pay

## 2020-06-19 ENCOUNTER — Ambulatory Visit (HOSPITAL_COMMUNITY)
Admission: RE | Admit: 2020-06-19 | Discharge: 2020-06-19 | Disposition: A | Discharge status: Home / Self Care | Payer: Medicare HMO | Source: Ambulatory Visit | Attending: Nephrology | Admitting: Nephrology

## 2020-06-19 DIAGNOSIS — N182 Chronic kidney disease, stage 2 (mild): Secondary | ICD-10-CM | POA: Insufficient documentation

## 2020-08-16 ENCOUNTER — Ambulatory Visit: Payer: Medicare HMO

## 2021-11-19 ENCOUNTER — Other Ambulatory Visit (HOSPITAL_COMMUNITY): Payer: Self-pay | Admitting: Nephrology

## 2021-11-26 ENCOUNTER — Encounter (HOSPITAL_COMMUNITY)
Admission: RE | Admit: 2021-11-26 | Discharge: 2021-11-26 | Disposition: A | Discharge status: Home / Self Care | Payer: Medicare HMO | Source: Ambulatory Visit | Attending: Nephrology | Admitting: Nephrology

## 2021-11-26 MED ORDER — TECHNETIUM TC 99M SESTAMIBI - CARDIOLITE
25.0000 | Freq: Once | INTRAVENOUS | Status: AC | PRN
  Administered 2021-11-26: 25.3 via INTRAVENOUS

## 2021-12-09 ENCOUNTER — Other Ambulatory Visit: Payer: Self-pay | Admitting: Gastroenterology

## 2021-12-09 ENCOUNTER — Other Ambulatory Visit (HOSPITAL_COMMUNITY): Payer: Self-pay | Admitting: Gastroenterology

## 2021-12-09 DIAGNOSIS — K8681 Exocrine pancreatic insufficiency: Secondary | ICD-10-CM

## 2021-12-24 ENCOUNTER — Other Ambulatory Visit (HOSPITAL_COMMUNITY): Payer: Self-pay | Admitting: Gastroenterology

## 2021-12-24 ENCOUNTER — Ambulatory Visit (HOSPITAL_COMMUNITY)
Admission: RE | Admit: 2021-12-24 | Discharge: 2021-12-24 | Disposition: A | Discharge status: Home / Self Care | Payer: Medicare HMO | Source: Ambulatory Visit | Attending: Gastroenterology | Admitting: Gastroenterology

## 2021-12-24 DIAGNOSIS — K8681 Exocrine pancreatic insufficiency: Secondary | ICD-10-CM | POA: Diagnosis present

## 2022-12-04 ENCOUNTER — Other Ambulatory Visit (HOSPITAL_COMMUNITY)
Admission: RE | Admit: 2022-12-04 | Discharge: 2022-12-04 | Disposition: A | Discharge status: Home / Self Care | Payer: Medicare Other | Source: Ambulatory Visit | Attending: Nephrology | Admitting: Nephrology

## 2022-12-04 DIAGNOSIS — R809 Proteinuria, unspecified: Secondary | ICD-10-CM | POA: Insufficient documentation

## 2022-12-04 DIAGNOSIS — E1122 Type 2 diabetes mellitus with diabetic chronic kidney disease: Secondary | ICD-10-CM | POA: Insufficient documentation

## 2022-12-04 DIAGNOSIS — N189 Chronic kidney disease, unspecified: Secondary | ICD-10-CM | POA: Insufficient documentation

## 2022-12-04 DIAGNOSIS — N2581 Secondary hyperparathyroidism of renal origin: Secondary | ICD-10-CM | POA: Diagnosis not present

## 2022-12-04 DIAGNOSIS — I129 Hypertensive chronic kidney disease with stage 1 through stage 4 chronic kidney disease, or unspecified chronic kidney disease: Secondary | ICD-10-CM | POA: Diagnosis not present

## 2022-12-04 DIAGNOSIS — Z01812 Encounter for preprocedural laboratory examination: Secondary | ICD-10-CM | POA: Diagnosis present

## 2022-12-04 DIAGNOSIS — Z79899 Other long term (current) drug therapy: Secondary | ICD-10-CM | POA: Diagnosis not present

## 2022-12-04 LAB — CBC
HCT: 39.5 % (ref 36.0–46.0)
Hemoglobin: 12.9 g/dL (ref 12.0–15.0)
MCH: 30.2 pg (ref 26.0–34.0)
MCHC: 32.7 g/dL (ref 30.0–36.0)
MCV: 92.5 fL (ref 80.0–100.0)
Platelets: 235 10*3/uL (ref 150–400)
RBC: 4.27 MIL/uL (ref 3.87–5.11)
RDW: 14.1 % (ref 11.5–15.5)
WBC: 6.5 10*3/uL (ref 4.0–10.5)
nRBC: 0 % (ref 0.0–0.2)

## 2022-12-04 LAB — RENAL FUNCTION PANEL
Albumin: 4.1 g/dL (ref 3.5–5.0)
Anion gap: 7 (ref 5–15)
BUN: 22 mg/dL (ref 8–23)
CO2: 25 mmol/L (ref 22–32)
Calcium: 9.9 mg/dL (ref 8.9–10.3)
Chloride: 103 mmol/L (ref 98–111)
Creatinine, Ser: 0.91 mg/dL (ref 0.44–1.00)
GFR, Estimated: >60 mL/min (ref >60)
Glucose, Bld: 141 mg/dL — ABNORMAL HIGH (ref 70–99)
Phosphorus: 2.7 mg/dL (ref 2.5–4.6)
Potassium: 4 mmol/L (ref 3.5–5.1)
Sodium: 135 mmol/L (ref 135–145)

## 2022-12-04 LAB — PROTEIN / CREATININE RATIO, URINE
Creatinine, Urine: 69 mg/dL
Protein Creatinine Ratio: 0.58 mg/mg{Cre} — ABNORMAL HIGH (ref 0.00–0.15)
Total Protein, Urine: 40 mg/dL

## 2022-12-04 LAB — VITAMIN D 25 HYDROXY (VIT D DEFICIENCY, FRACTURES): Vit D, 25-Hydroxy: 29 ng/mL — ABNORMAL LOW (ref 30–100)

## 2022-12-07 LAB — PTH, INTACT AND CALCIUM
Calcium, Total (PTH): 10.5 mg/dL — ABNORMAL HIGH (ref 8.7–10.3)
PTH: 43 pg/mL (ref 15–65)

## 2023-04-12 ENCOUNTER — Other Ambulatory Visit (HOSPITAL_COMMUNITY): Payer: Self-pay | Admitting: Adult Health Nurse Practitioner

## 2023-04-12 DIAGNOSIS — M810 Age-related osteoporosis without current pathological fracture: Secondary | ICD-10-CM

## 2023-04-19 ENCOUNTER — Other Ambulatory Visit (HOSPITAL_COMMUNITY): Payer: Self-pay | Admitting: Adult Health Nurse Practitioner

## 2023-04-19 ENCOUNTER — Inpatient Hospital Stay
Admission: RE | Admit: 2023-04-19 | Discharge: 2023-04-19 | Disposition: A | Discharge status: Home / Self Care | Payer: Self-pay | Source: Ambulatory Visit | Attending: Adult Health Nurse Practitioner | Admitting: Adult Health Nurse Practitioner

## 2023-04-19 DIAGNOSIS — N6489 Other specified disorders of breast: Secondary | ICD-10-CM

## 2023-05-06 ENCOUNTER — Ambulatory Visit (HOSPITAL_COMMUNITY)
Admission: RE | Admit: 2023-05-06 | Discharge: 2023-05-06 | Disposition: A | Discharge status: Home / Self Care | Payer: Medicare Other | Source: Ambulatory Visit | Attending: Adult Health Nurse Practitioner | Admitting: Adult Health Nurse Practitioner

## 2023-05-06 DIAGNOSIS — N6489 Other specified disorders of breast: Secondary | ICD-10-CM | POA: Insufficient documentation

## 2023-05-06 DIAGNOSIS — M810 Age-related osteoporosis without current pathological fracture: Secondary | ICD-10-CM

## 2023-10-05 ENCOUNTER — Telehealth (INDEPENDENT_AMBULATORY_CARE_PROVIDER_SITE_OTHER): Payer: Self-pay | Admitting: *Deleted

## 2023-10-05 NOTE — Telephone Encounter (Signed)
 Patient called back here, wanted to see one of our providers - referral changed to [facility] and patient has been schd for 10/06/23 at 930 with Dr Alita Irwin

## 2023-10-05 NOTE — Telephone Encounter (Signed)
 Patient left message returning your call to schd apt - please call [phone]

## 2023-10-06 ENCOUNTER — Ambulatory Visit (INDEPENDENT_AMBULATORY_CARE_PROVIDER_SITE_OTHER): Admitting: Gastroenterology

## 2023-10-21 ENCOUNTER — Ambulatory Visit (INDEPENDENT_AMBULATORY_CARE_PROVIDER_SITE_OTHER): Admitting: Gastroenterology

## 2023-11-19 ENCOUNTER — Encounter (INDEPENDENT_AMBULATORY_CARE_PROVIDER_SITE_OTHER): Payer: Self-pay | Admitting: Gastroenterology

## 2023-11-19 ENCOUNTER — Ambulatory Visit (INDEPENDENT_AMBULATORY_CARE_PROVIDER_SITE_OTHER): Admitting: Gastroenterology

## 2023-11-19 VITALS — BP 156/86 | HR 52 | Temp 97.5°F | Ht 64.0 in | Wt 199.0 lb

## 2023-11-19 DIAGNOSIS — K8681 Exocrine pancreatic insufficiency: Secondary | ICD-10-CM | POA: Diagnosis not present

## 2023-11-19 DIAGNOSIS — I10 Essential (primary) hypertension: Secondary | ICD-10-CM

## 2023-11-19 DIAGNOSIS — K9089 Other intestinal malabsorption: Secondary | ICD-10-CM

## 2023-11-19 DIAGNOSIS — R1013 Epigastric pain: Secondary | ICD-10-CM | POA: Insufficient documentation

## 2023-11-19 MED ORDER — CHOLESTYRAMINE 4 G PO PACK: 4.0000 g | PACK | Freq: Every day | ORAL | 5 refills | Status: AC

## 2023-11-19 NOTE — Patient Instructions (Signed)
 It was very nice to meet you today, as dicussed with will plan for the following :  1) BEANO with vegetables Monica Murray .lentils

## 2023-11-19 NOTE — Progress Notes (Signed)
 Monica Murray , M.D. Gastroenterology & Hepatology [facility] Gastroenterology [address] [place], [place] [address] Primary Care Physician: Pcp, No No address on file  Chief Complaint:   exocrine pancreatic insufficiency  History of Present Illness: Monica Murray is a 89 y.o. female with diabetes, exocrine pancreatic insufficiency, hypertension who presents for evaluation of diarrhea and exocrine pancreatic insufficiency  Patient reports that she is doing well but has occasional bloating and abdominal discomfort when eating salad or vegetables .patient reports that she gets Zenpep through [employer] patient assistance 40k unit at least twice daily and 1 with snack.  Patient recently ran out of cholestyramine 4 g which she takes at bedtime and started to have diarrhea.The patient denies having any nausea, vomiting, fever, chills, hematochezia, melena, hematemesis,  jaundice, pruritus or weight loss.  Last Colonoscopy: Flex Sig  2018  - RECTAL BLEEDING DUE TO INTERNAL hemorrhoids - THROMBOSED EXTERNAL hemorrhoids. - MODERATE Diverticulosis in the sigmoid colon.  Social: neg smoking, alcohol or illicit drug use Surgical: no abdominal surgeries  From 03/2023 hemoglobin 12 normal liver enzymes vitamin D  30 Hemoglobin A1c 7.1  Past Medical History: Past Medical History:  Diagnosis Date   Chronic constipation    Chronic diarrhea    Clostridium difficile infection    Diabetes (HCC)    since 2014   Diverticulitis    Enteritis due to Clostridium difficile 07/16/2016   Hypertension    Irritable bowel syndrome    LLQ pain    Rectal bleed    Renal disorder    kidney stone    Past Surgical History: Past Surgical History:  Procedure Laterality Date   BREAST BIOPSY     BURN TREATMENT     COLONOSCOPY  12/06/07   NUR   FLEXIBLE SIGMOIDOSCOPY N/A 09/15/2016   Procedure: FLEXIBLE SIGMOIDOSCOPY;  Surgeon: Alyce Jubilee, MD;  [facility];  Service: Endoscopy;  Laterality: N/A;  2:45 pm   hemorroid surgery     KIDNEY STONE SURGERY     SIGMOIDOSCOPY  05/24/95   NUR   TONSILLECTOMY      Family History: Family History  Problem Relation Age of Onset   Breast cancer Sister    Breast cancer Mother    Breast cancer Other     Social History: Social History   Tobacco Use  Smoking Status Never  Smokeless Tobacco Never   Social History   Substance and Sexual Activity  Alcohol Use No   Social History   Substance and Sexual Activity  Drug Use No    Allergies: Allergies  Allergen Reactions   Macrodantin Nausea Only   Sulfa Antibiotics Nausea Only    Medications: Current Outpatient Medications  Medication Sig Dispense Refill   alendronate (FOSAMAX) 35 MG tablet Take 35 mg by mouth every 7 (seven) days. Take with a full glass of water  on an empty stomach.     aspirin  EC 81 MG tablet Take 1 tablet (81 mg total) by mouth daily.     cholestyramine (QUESTRAN) 4 g packet Take 1 packet (4 g total) by mouth daily. Mix with 4-6 oz liquid.  Take other meds 1 hr before or 4-6 hr after cholestyramine. 30 packet 5   cloNIDine (CATAPRES) 0.1 MG tablet Take 0.1 mg by mouth at bedtime.     escitalopram  (LEXAPRO ) 20 MG tablet Take 1 tablet (20 mg total) by mouth daily. 90 tablet 1   furosemide  (LASIX ) 20 MG tablet Take 1 tablet  by mouth twice daily 180 tablet 0   glipiZIDE  (GLUCOTROL  XL) 2.5 MG 24 hr tablet Take 1 tablet (2.5 mg total) by mouth daily with breakfast. (Patient taking differently: Take 5 mg by mouth daily with breakfast.) 90 tablet 1   lisinopril  (ZESTRIL ) 5 MG tablet Take 1 tablet (5 mg total) by mouth daily. (Patient taking differently: Take 2.5 mg by mouth daily.) 90 tablet 3   metoprolol  tartrate (LOPRESSOR ) 25 MG tablet Take 1 tablet (25 mg total) by mouth 2 (two) times daily. 180 tablet 1   Pancrelipase, Lip-Prot-Amyl, (ZENPEP) 40000-126000 units CPEP Take by mouth. 2 with meal TID and 1 with snacks.  (Gets patient assistance through [employer]).     pantoprazole (PROTONIX) 20 MG tablet Take 20 mg by mouth daily.     pravastatin  (PRAVACHOL ) 80 MG tablet Take 1 tablet (80 mg total) by mouth daily. (Patient taking differently: Take 40 mg by mouth daily.) 90 tablet 1   No current facility-administered medications for this visit.    Review of Systems: GENERAL: negative for malaise, night sweats HEENT: No changes in hearing or vision, no nose bleeds or other nasal problems. NECK: Negative for lumps, goiter, pain and significant neck swelling RESPIRATORY: Negative for cough, wheezing CARDIOVASCULAR: Negative for chest pain, leg swelling, palpitations, orthopnea GI: SEE HPI MUSCULOSKELETAL: Negative for joint pain or swelling, back pain, and muscle pain. SKIN: Negative for lesions, rash HEMATOLOGY Negative for prolonged bleeding, bruising easily, and swollen nodes. ENDOCRINE: Negative for cold or heat intolerance, polyuria, polydipsia and goiter. NEURO: negative for tremor, gait imbalance, syncope and seizures. The remainder of the review of systems is noncontributory.   Physical Exam: BP (!) 156/86 (BP Location: Left Arm, Patient Position: Sitting, Cuff Size: Large)   Pulse (!) 52   Temp (!) 97.5 F (36.4 C) (Temporal)   Ht 5\' 4"  (1.626 m)   Wt 199 lb (90.3 kg)   BMI 34.16 kg/m  GENERAL: The patient is AO x3, in no acute distress. HEENT: Head is normocephalic and atraumatic. EOMI are intact. Mouth is well hydrated and without lesions. NECK: Supple. No masses LUNGS: Clear to auscultation. No presence of rhonchi/wheezing/rales. Adequate chest expansion HEART: RRR, normal s1 and s2. ABDOMEN: Soft, nontender, no guarding, no peritoneal signs, and nondistended. BS +. No masses.  Imaging/Labs: as above     Latest Ref Rng & Units 12/04/2022   10:08 AM 03/25/2017   10:10 AM 04/03/2016    9:34 AM  CBC  WBC 4.0 - 10.5 K/uL 6.5  14.3  8.6   Hemoglobin 12.0 - 15.0 g/dL 69.6  78.9  38.1    Hematocrit 36.0 - 46.0 % 39.5  41.1  37.9   Platelets 150 - 400 K/uL 235  184  205    No results found for: "IRON", "TIBC", "FERRITIN"  I personally reviewed and interpreted the available labs, imaging and endoscopic files.  Impression and Plan:  JADZIA IBSEN is a 88 y.o. female with diabetes, exocrine pancreatic insufficiency, hypertension who presents for evaluation of diarrhea and exocrine pancreatic insufficiency  #Diarrhea due to malabsorption # Exocrine pancreatic sufficiency  Patient is on Zenpep 40k units twice daily with meals and also with snacks for 10+ years and gets its through [employer] patient assistance  She also has been on cholestyramine  4 g at bedtime which significantly improves her symptoms.  Occasional bloating and distention with solid, vegetable for which I recommend Beano as needed  Continue  treatment of EPI in chronic pancreatitis, the optimal  dose is at least 40,000 units of lipase per meal. The optimal timing is while consuming a meal and not before or after.  Proton pump inhibitors may improve the efficacy of the pancrelipase.   Patient is of advanced age and above the age of 22 screening colonoscopy is not recommended.  #HTN  The patient was found to have elevated blood pressure when vital signs were checked in the office. The blood pressure was rechecked by the nursing staff and it was found be persistently elevated >140/90 mmHg. I personally advised to the patient to follow up closely with PCP for hypertension control.    All questions were answered.      Adanya Sosinski Faizan Stefon Ramthun, MD Gastroenterology and Hepatology [facility] Gastroenterology   This chart has been completed using [facility] Dictation software, and while attempts have been made to ensure accuracy , certain words and phrases may not be transcribed as intended

## 2023-11-23 ENCOUNTER — Ambulatory Visit (INDEPENDENT_AMBULATORY_CARE_PROVIDER_SITE_OTHER): Admitting: Gastroenterology

## 2024-01-31 ENCOUNTER — Emergency Department (HOSPITAL_COMMUNITY)
Admission: EM | Admit: 2024-01-31 | Discharge: 2024-01-31 | Disposition: A | Discharge status: Home / Self Care | Source: Ambulatory Visit | Attending: Emergency Medicine | Admitting: Emergency Medicine

## 2024-01-31 ENCOUNTER — Encounter (HOSPITAL_COMMUNITY): Payer: Self-pay

## 2024-01-31 ENCOUNTER — Emergency Department (HOSPITAL_COMMUNITY)

## 2024-01-31 DIAGNOSIS — Z7982 Long term (current) use of aspirin: Secondary | ICD-10-CM | POA: Insufficient documentation

## 2024-01-31 DIAGNOSIS — R0602 Shortness of breath: Secondary | ICD-10-CM | POA: Insufficient documentation

## 2024-01-31 DIAGNOSIS — E860 Dehydration: Secondary | ICD-10-CM

## 2024-01-31 DIAGNOSIS — R053 Chronic cough: Secondary | ICD-10-CM

## 2024-01-31 LAB — CBC WITH DIFFERENTIAL/PLATELET
Abs Immature Granulocytes: 0.01 K/uL (ref 0.00–0.07)
Basophils Absolute: 0.1 K/uL (ref 0.0–0.1)
Basophils Relative: 1 %
Eosinophils Absolute: 0.4 K/uL (ref 0.0–0.5)
Eosinophils Relative: 7 %
HCT: 40.8 % (ref 36.0–46.0)
Hemoglobin: 13.2 g/dL (ref 12.0–15.0)
Immature Granulocytes: 0 %
Lymphocytes Relative: 21 %
Lymphs Abs: 1.3 K/uL (ref 0.7–4.0)
MCH: 30.5 pg (ref 26.0–34.0)
MCHC: 32.4 g/dL (ref 30.0–36.0)
MCV: 94.2 fL (ref 80.0–100.0)
Monocytes Absolute: 0.5 K/uL (ref 0.1–1.0)
Monocytes Relative: 9 %
Neutro Abs: 3.7 K/uL (ref 1.7–7.7)
Neutrophils Relative %: 62 %
Platelets: 208 K/uL (ref 150–400)
RBC: 4.33 MIL/uL (ref 3.87–5.11)
RDW: 14.6 % (ref 11.5–15.5)
WBC: 6 K/uL (ref 4.0–10.5)
nRBC: 0 % (ref 0.0–0.2)

## 2024-01-31 LAB — COMPREHENSIVE METABOLIC PANEL WITH GFR
ALT: 60 U/L — ABNORMAL HIGH (ref 0–44)
AST: 37 U/L (ref 15–41)
Albumin: 4.1 g/dL (ref 3.5–5.0)
Alkaline Phosphatase: 235 U/L — ABNORMAL HIGH (ref 38–126)
Anion gap: 12 (ref 5–15)
BUN: 31 mg/dL — ABNORMAL HIGH (ref 8–23)
CO2: 30 mmol/L (ref 22–32)
Calcium: 11 mg/dL — ABNORMAL HIGH (ref 8.9–10.3)
Chloride: 96 mmol/L — ABNORMAL LOW (ref 98–111)
Creatinine, Ser: 1.22 mg/dL — ABNORMAL HIGH (ref 0.44–1.00)
GFR, Estimated: 43 mL/min — ABNORMAL LOW (ref >60)
Glucose, Bld: 109 mg/dL — ABNORMAL HIGH (ref 70–99)
Potassium: 4.4 mmol/L (ref 3.5–5.1)
Sodium: 138 mmol/L (ref 135–145)
Total Bilirubin: 1.1 mg/dL (ref 0.0–1.2)
Total Protein: 7.2 g/dL (ref 6.5–8.1)

## 2024-01-31 LAB — CBG MONITORING, ED: Glucose-Capillary: 116 mg/dL — ABNORMAL HIGH (ref 70–99)

## 2024-01-31 LAB — SARS CORONAVIRUS 2 BY RT PCR: SARS Coronavirus 2 by RT PCR: NEGATIVE

## 2024-01-31 MED ORDER — SODIUM CHLORIDE 0.9 % IV BOLUS
1000.0000 mL | Freq: Once | INTRAVENOUS | Status: AC
  Administered 2024-01-31 (×2): 1000 mL via INTRAVENOUS

## 2024-01-31 MED ORDER — DOXYCYCLINE HYCLATE 100 MG PO CAPS: 100.0000 mg | ORAL_CAPSULE | Freq: Two times a day (BID) | ORAL | 0 refills | Status: AC

## 2024-01-31 NOTE — ED Provider Notes (Signed)
 [place] EMERGENCY DEPARTMENT AT [facility] Provider Note   CSN: [id number] Arrival date & time: 01/31/24  1053     Patient presents with: Cough and Shortness of Breath   Monica Murray is a 88 y.o. female.   Patient presents with fatigue and persistent cough.  She been on antibiotic once without improvement  The history is provided by the patient and medical records. No language interpreter was used.  Cough Cough characteristics:  Non-productive Sputum characteristics:  Nondescript Severity:  Moderate Onset quality:  Sudden Timing:  Constant Progression:  Waxing and waning Chronicity:  Recurrent Smoker: no   Context: not animal exposure   Relieved by:  Nothing Associated symptoms: shortness of breath   Associated symptoms: no chest pain, no eye discharge, no headaches and no rash   Shortness of Breath Associated symptoms: cough   Associated symptoms: no abdominal pain, no chest pain, no headaches and no rash        Prior to Admission medications   Medication Sig Start Date End Date Taking? Authorizing Provider  doxycycline  (VIBRAMYCIN ) 100 MG capsule Take 1 capsule (100 mg total) by mouth 2 (two) times daily. One po bid x 7 days 01/31/24  Yes Jove Beyl, MD  alendronate (FOSAMAX) 35 MG tablet Take 35 mg by mouth every 7 (seven) days. Take with a full glass of water  on an empty stomach.    [provider]  aspirin  EC 81 MG tablet Take 1 tablet (81 mg total) by mouth daily. 03/03/19   Joshua Clayborne RAMAN, PA-C  cholestyramine  (QUESTRAN ) 4 g packet Take 1 packet (4 g total) by mouth daily. Mix with 4-6 oz liquid.  Take other meds 1 hr before or 4-6 hr after cholestyramine . 11/19/23 05/17/24  Ahmed, Muhammad F, MD  cloNIDine (CATAPRES) 0.1 MG tablet Take 0.1 mg by mouth at bedtime.    [provider]  escitalopram  (LEXAPRO ) 20 MG tablet Take 1 tablet (20 mg total) by mouth daily. 08/16/19   Joshua Clayborne RAMAN, PA-C  furosemide  (LASIX ) 20 MG tablet  Take 1 tablet by mouth twice daily 08/25/19   Joshua Clayborne RAMAN, PA-C  glipiZIDE  (GLUCOTROL  XL) 2.5 MG 24 hr tablet Take 1 tablet (2.5 mg total) by mouth daily with breakfast. Patient taking differently: Take 5 mg by mouth daily with breakfast. 08/16/19   Joshua Clayborne RAMAN, PA-C  lisinopril  (ZESTRIL ) 5 MG tablet Take 1 tablet (5 mg total) by mouth daily. Patient taking differently: Take 2.5 mg by mouth daily. 03/03/19   Joshua Clayborne RAMAN, PA-C  metoprolol  tartrate (LOPRESSOR ) 25 MG tablet Take 1 tablet (25 mg total) by mouth 2 (two) times daily. 08/16/19   Joshua Clayborne RAMAN, PA-C  Pancrelipase, Lip-Prot-Amyl, (ZENPEP) 40000-126000 units CPEP Take by mouth. 2 with meal TID and 1 with snacks. (Gets patient assistance through [employer]).    [provider]  pantoprazole (PROTONIX) 20 MG tablet Take 20 mg by mouth daily.    [provider]  pravastatin  (PRAVACHOL ) 80 MG tablet Take 1 tablet (80 mg total) by mouth daily. Patient taking differently: Take 40 mg by mouth daily. 03/10/19   Joshua Clayborne RAMAN, PA-C    Allergies: Macrodantin and Sulfa antibiotics    Review of Systems  Constitutional:  Negative for appetite change and fatigue.  HENT:  Negative for congestion, ear discharge and sinus pressure.   Eyes:  Negative for discharge.  Respiratory:  Positive for cough and shortness of breath.   Cardiovascular:  Negative for chest pain.  Gastrointestinal:  Negative for abdominal pain and diarrhea.  Genitourinary:  Negative for frequency and hematuria.  Musculoskeletal:  Negative for back pain.  Skin:  Negative for rash.  Neurological:  Negative for seizures and headaches.  Psychiatric/Behavioral:  Negative for hallucinations.     Updated Vital Signs BP (!) 176/92   Pulse 65   Temp 97.7 F (36.5 C) (Oral)   Resp 18   Ht 5' 4 (1.626 m)   Wt 88.5 kg   SpO2 96%   BMI 33.47 kg/m   Physical Exam Vitals and nursing note reviewed.  Constitutional:      Appearance: She is well-developed.   HENT:     Head: Normocephalic.     Nose: Nose normal.  Eyes:     General: No scleral icterus.    Conjunctiva/sclera: Conjunctivae normal.  Neck:     Thyroid: No thyromegaly.  Cardiovascular:     Rate and Rhythm: Normal rate and regular rhythm.     Heart sounds: No murmur heard.    No friction rub. No gallop.  Pulmonary:     Breath sounds: No stridor. No wheezing or rales.  Chest:     Chest wall: No tenderness.  Abdominal:     General: There is no distension.     Tenderness: There is no abdominal tenderness. There is no rebound.  Musculoskeletal:        General: Normal range of motion.     Cervical back: Neck supple.  Lymphadenopathy:     Cervical: No cervical adenopathy.  Skin:    Findings: No erythema or rash.  Neurological:     Mental Status: She is alert and oriented to person, place, and time.     Motor: No abnormal muscle tone.     Coordination: Coordination normal.  Psychiatric:        Behavior: Behavior normal.     (all labs ordered are listed, but only abnormal results are displayed) Labs Reviewed  COMPREHENSIVE METABOLIC PANEL WITH GFR - Abnormal; Notable for the following components:      Result Value   Chloride 96 (*)    Glucose, Bld 109 (*)    BUN 31 (*)    Creatinine, Ser 1.22 (*)    Calcium 11.0 (*)    ALT 60 (*)    Alkaline Phosphatase 235 (*)    GFR, Estimated 43 (*)    All other components within normal limits  CBG MONITORING, ED - Abnormal; Notable for the following components:   Glucose-Capillary 116 (*)    All other components within normal limits  SARS CORONAVIRUS 2 BY RT PCR  CBC WITH DIFFERENTIAL/PLATELET    EKG: None  Radiology: [facility] Chest Port 1 View Result Date: 01/31/2024 CLINICAL DATA:  Shortness of breath, cough. EXAM: PORTABLE CHEST 1 VIEW COMPARISON:  None. FINDINGS: Trachea is midline. Heart is at the upper limits of normal in size to mildly enlarged. Thoracic aorta is calcified. Lungs are clear. No pleural fluid. IMPRESSION:  No acute findings. Electronically Signed   By: Newell Eke M.D.   On: 01/31/2024 12:54     Procedures   Medications Ordered in the ED  sodium chloride  0.9 % bolus 1,000 mL (0 mLs Intravenous Stopped 01/31/24 1557)                                    Medical Decision Making Amount and/or Complexity of Data Reviewed Labs: ordered.  Radiology: ordered.  Risk Prescription drug management.   Patient with dehydration and hypercalcemia.  She has been given a liter of fluids here and will follow-up with her PCP.  She also was given doxycycline  for persistent cough     Final diagnoses:  Chronic cough  Hypercalcemia  Dehydration    ED Discharge Orders          Ordered    doxycycline  (VIBRAMYCIN ) 100 MG capsule  2 times daily        01/31/24 1507               Suzette Pac, MD 01/31/24 1616

## 2024-01-31 NOTE — ED Triage Notes (Signed)
 Pt comes in for SOB and a productive cough (cloudy and green). Pt was dx by PCP w/ walking PNA. Pt was given a shot and medication (unknown name) and is not feeling better. Pt is A&Ox4.

## 2024-01-31 NOTE — Discharge Instructions (Signed)
 Drink plenty of fluids and follow-up with your PCP tomorrow

## 2024-03-30 ENCOUNTER — Other Ambulatory Visit (HOSPITAL_COMMUNITY): Payer: Self-pay | Admitting: Adult Health Nurse Practitioner

## 2024-03-30 DIAGNOSIS — Z1231 Encounter for screening mammogram for malignant neoplasm of breast: Secondary | ICD-10-CM

## 2024-04-04 ENCOUNTER — Other Ambulatory Visit (HOSPITAL_COMMUNITY): Payer: Self-pay | Admitting: Adult Health Nurse Practitioner

## 2024-04-04 DIAGNOSIS — N644 Mastodynia: Secondary | ICD-10-CM

## 2024-04-18 ENCOUNTER — Ambulatory Visit (HOSPITAL_COMMUNITY)
Admission: RE | Admit: 2024-04-18 | Discharge: 2024-04-18 | Disposition: A | Discharge status: Home / Self Care | Source: Ambulatory Visit | Attending: Adult Health Nurse Practitioner | Admitting: Adult Health Nurse Practitioner

## 2024-04-18 ENCOUNTER — Ambulatory Visit (HOSPITAL_COMMUNITY)
Admission: RE | Admit: 2024-04-18 | Discharge: 2024-04-18 | Disposition: A | Source: Ambulatory Visit | Attending: Adult Health Nurse Practitioner

## 2024-04-18 ENCOUNTER — Encounter (HOSPITAL_COMMUNITY): Payer: Self-pay

## 2024-04-18 DIAGNOSIS — N644 Mastodynia: Secondary | ICD-10-CM

## 2024-04-21 ENCOUNTER — Encounter (INDEPENDENT_AMBULATORY_CARE_PROVIDER_SITE_OTHER): Payer: Self-pay | Admitting: Gastroenterology

## 2024-04-26 ENCOUNTER — Encounter (INDEPENDENT_AMBULATORY_CARE_PROVIDER_SITE_OTHER): Payer: Self-pay | Admitting: Gastroenterology

## 2024-07-05 ENCOUNTER — Other Ambulatory Visit (HOSPITAL_COMMUNITY): Payer: Self-pay | Admitting: Nephrology

## 2024-07-05 DIAGNOSIS — N1832 Chronic kidney disease, stage 3b: Secondary | ICD-10-CM

## 2024-07-05 DIAGNOSIS — E1122 Type 2 diabetes mellitus with diabetic chronic kidney disease: Secondary | ICD-10-CM

## 2024-07-10 ENCOUNTER — Ambulatory Visit (HOSPITAL_COMMUNITY)
Admission: RE | Admit: 2024-07-10 | Discharge: 2024-07-10 | Disposition: A | Discharge status: Home / Self Care | Source: Ambulatory Visit | Attending: Nephrology | Admitting: Nephrology

## 2024-07-10 DIAGNOSIS — E1122 Type 2 diabetes mellitus with diabetic chronic kidney disease: Secondary | ICD-10-CM | POA: Insufficient documentation

## 2024-07-10 DIAGNOSIS — N1832 Chronic kidney disease, stage 3b: Secondary | ICD-10-CM | POA: Insufficient documentation

## 2024-07-12 ENCOUNTER — Ambulatory Visit: Admitting: Urology

## 2024-07-12 ENCOUNTER — Ambulatory Visit (HOSPITAL_COMMUNITY)
Admission: RE | Admit: 2024-07-12 | Discharge: 2024-07-12 | Disposition: A | Discharge status: Home / Self Care | Source: Ambulatory Visit | Attending: Urology | Admitting: Urology

## 2024-07-12 ENCOUNTER — Encounter: Payer: Self-pay | Admitting: Urology

## 2024-07-12 VITALS — BP 144/82 | HR 80

## 2024-07-12 DIAGNOSIS — N200 Calculus of kidney: Secondary | ICD-10-CM | POA: Diagnosis present

## 2024-07-12 DIAGNOSIS — N1330 Unspecified hydronephrosis: Secondary | ICD-10-CM | POA: Insufficient documentation

## 2024-07-12 LAB — URINALYSIS, ROUTINE W REFLEX MICROSCOPIC
Bilirubin, UA: NEGATIVE
Glucose, UA: NEGATIVE
Ketones, UA: NEGATIVE
Nitrite, UA: NEGATIVE
Protein,UA: NEGATIVE
RBC, UA: NEGATIVE
Specific Gravity, UA: 1.01 (ref 1.005–1.030)
Urobilinogen, Ur: 0.2 mg/dL (ref 0.2–1.0)
pH, UA: 6 (ref 5.0–7.5)

## 2024-07-12 LAB — MICROSCOPIC EXAMINATION: Bacteria, UA: NONE SEEN

## 2024-07-12 NOTE — Progress Notes (Unsigned)
 "  07/12/2024 1:13 PM   Monica Murray April 13, 1936 [id number]  Referring provider: Suanne Pfeiffer, NP [address] [place],  [place] [address]  Left hydronephrosis   HPI: Monica Murray is a 88yo here for evaluation of left hydronephrosis. She underwent renal US  07/10/2024 which showed severe left hydronephrosis and left renal atrophy and moderate right hydronephrosis.  She had multiple open pyelolithotomies of both kidneys. Her surgery was in the 1960s  She has known about her nonfunctioning kidney for 5 years. She has chronic right flank pain. No left flank pain. She denies any straining to urinate. She has occasional urge incontinence.     PMH: Past Medical History:  Diagnosis Date   Chronic constipation    Chronic diarrhea    Clostridium difficile infection    Diabetes (HCC)    since 2014   Diverticulitis    Enteritis due to Clostridium difficile 07/16/2016   Hypertension    Irritable bowel syndrome    LLQ pain    Rectal bleed    Renal disorder    kidney stone    Surgical History: Past Surgical History:  Procedure Laterality Date   BREAST BIOPSY     benign   BURN TREATMENT     COLONOSCOPY  12/06/2007   NUR   FLEXIBLE SIGMOIDOSCOPY N/A 09/15/2016   Procedure: FLEXIBLE SIGMOIDOSCOPY;  Surgeon: Margo LITTIE Haddock, MD;  [facility];  Service: Endoscopy;  Laterality: N/A;  2:45 pm   hemorroid surgery     KIDNEY STONE SURGERY     SIGMOIDOSCOPY  05/24/1995   NUR   TONSILLECTOMY      Home Medications:  Allergies as of 07/12/2024       Reactions   Macrodantin Nausea Only   Sulfa Antibiotics Nausea Only        Medication List        Accurate as of July 12, 2024  1:13 PM. If you have any questions, ask your nurse or doctor.          alendronate 35 MG tablet Commonly known as: FOSAMAX Take 35 mg by mouth every 7 (seven) days. Take with a full glass of water  on an empty stomach.   aspirin  EC 81 MG tablet Take 1 tablet (81 mg total) by  mouth daily.   cholestyramine  4 g packet Commonly known as: QUESTRAN  Take 1 packet (4 g total) by mouth daily. Mix with 4-6 oz liquid.  Take other meds 1 hr before or 4-6 hr after cholestyramine .   cloNIDine 0.1 MG tablet Commonly known as: CATAPRES Take 0.1 mg by mouth at bedtime.   doxycycline  100 MG capsule Commonly known as: VIBRAMYCIN  Take 1 capsule (100 mg total) by mouth 2 (two) times daily. One po bid x 7 days   escitalopram  20 MG tablet Commonly known as: LEXAPRO  Take 1 tablet (20 mg total) by mouth daily.   furosemide  20 MG tablet Commonly known as: LASIX  Take 1 tablet by mouth twice daily   glipiZIDE  2.5 MG 24 hr tablet Commonly known as: GLUCOTROL  XL Take 1 tablet (2.5 mg total) by mouth daily with breakfast. What changed: how much to take   lisinopril  5 MG tablet Commonly known as: ZESTRIL  Take 1 tablet (5 mg total) by mouth daily. What changed: how much to take   metoprolol  tartrate 25 MG tablet Commonly known as: LOPRESSOR  Take 1 tablet (25 mg total) by mouth 2 (two) times daily.   oxybutynin 10 MG 24 hr  tablet Commonly known as: DITROPAN-XL Take 10 mg by mouth daily.   pantoprazole 20 MG tablet Commonly known as: PROTONIX Take 20 mg by mouth daily.   pravastatin  80 MG tablet Commonly known as: PRAVACHOL  Take 1 tablet (80 mg total) by mouth daily. What changed: how much to take   Zenpep 40000-126000 units Cpep Generic drug: Pancrelipase (Lip-Prot-Amyl) Take by mouth. 2 with meal TID and 1 with snacks. (Gets patient assistance through [employer]).        Allergies: Allergies[1]  Family History: Family History  Problem Relation Age of Onset   Breast cancer Mother 51 - 67   Breast cancer Sister    Breast cancer Other 36 - 61    Social History:  reports that she has never smoked. She has never used smokeless tobacco. She reports that she does not drink alcohol and does not use drugs.  ROS: All other review of systems were reviewed and are  negative except what is noted above in HPI  Physical Exam: BP (!) 144/82   Pulse 80   Constitutional:  Alert and oriented, No acute distress. HEENT: [place] AT, moist mucus membranes.  Trachea midline, no masses. Cardiovascular: No clubbing, cyanosis, or edema. Respiratory: Normal respiratory effort, no increased work of breathing. GI: Abdomen is soft, nontender, nondistended, no abdominal masses GU: No CVA tenderness.  Lymph: No cervical or inguinal lymphadenopathy. Skin: No rashes, bruises or suspicious lesions. Neurologic: Grossly intact, no focal deficits, moving all 4 extremities. Psychiatric: Normal mood and affect.  Laboratory Data: Lab Results  Component Value Date   WBC 6.0 01/31/2024   HGB 13.2 01/31/2024   HCT 40.8 01/31/2024   MCV 94.2 01/31/2024   PLT 208 01/31/2024    Lab Results  Component Value Date   CREATININE 1.22 (H) 01/31/2024    No results found for: PSA  No results found for: TESTOSTERONE  Lab Results  Component Value Date   HGBA1C 5.6 05/01/2019    Urinalysis    Component Value Date/Time   COLORURINE YELLOW 05/17/2014 0814   APPEARANCEUR Clear 05/24/2019 1411   LABSPEC 1.020 05/17/2014 0814   PHURINE 6.5 05/17/2014 0814   GLUCOSEU Negative 05/24/2019 1411   HGBUR LARGE (A) 05/17/2014 0814   BILIRUBINUR Negative 05/24/2019 1411   KETONESUR NEGATIVE 05/17/2014 0814   PROTEINUR Negative 05/24/2019 1411   PROTEINUR >300 (A) 05/17/2014 0814   UROBILINOGEN 0.2 05/17/2014 0814   NITRITE Negative 05/24/2019 1411   NITRITE POSITIVE (A) 05/17/2014 0814   LEUKOCYTESUR 2+ (A) 05/24/2019 1411    Lab Results  Component Value Date   LABMICR See below: 05/24/2019   WBCUA 11-30 (A) 05/24/2019   LABEPIT 0-10 05/24/2019   BACTERIA Few 05/24/2019    Pertinent Imaging: Renal US  07/10/2024: Images reviewed and discussed with the patient No results found for this or any previous visit.  No results found for this or any previous visit.  No  results found for this or any previous visit.  No results found for this or any previous visit.  Results for orders placed during the hospital encounter of 07/10/24  US  RENAL  Narrative US  KIDNEY BILATERAL  CLINICAL HISTORY: Chronic kidney disease stage III  TECHNIQUE: Sonographic evaluation of the kidneys and urinary bladder was performed using grayscale and color Doppler imaging.  COMPARISON: June 19, 2020 renal ultrasound. MRCP December 24, 2021.  FINDINGS: RIGHT KIDNEY: Measures 11 x 5.7 x 6.5 cm with a volume of 213 mL. Moderate hydronephrosis. LEFT KIDNEY: Measures 5.6 x 3 x  2.4 cm with a volume of 20 mL. Severe hydronephrosis. URINARY BLADDER: Normal OTHER: No additional abnormality.   IMPRESSION: Moderate right and severe left hydronephrosis.  Left renal atrophy.  Similar findings were present on MRCP December 24, 2021.  Electronically signed by: Margretta Blanch MD 07/10/2024 03:48 PM EST RP Workstation: [id number]  No results found for this or any previous visit.  No results found for this or any previous visit.  No results found for this or any previous visit.   Assessment & Plan:    1. Hydronephrosis, unspecified hydronephrosis type (Primary) CT stone study, will call with results - Urinalysis, Routine w reflex microscopic   No follow-ups on file.  Belvie Clara, MD  [facility] [place]       [1]  Allergies Allergen Reactions   Macrodantin Nausea Only   Sulfa Antibiotics Nausea Only   "

## 2024-07-13 ENCOUNTER — Encounter: Payer: Self-pay | Admitting: Urology

## 2024-07-13 NOTE — Patient Instructions (Signed)
 Hydronephrosis  Hydronephrosis is the swelling of one or both kidneys due to a blockage that stops urine from flowing out of the body. Kidneys filter waste from the blood and produce urine. This condition can lead to kidney failure and may become life-threatening if not treated promptly. What are the causes? In infants and children, common causes include problems that occur when a baby is developing in the womb. These can include problems in the kidneys or in the tubes that drain urine into the bladder (ureters). In adults, common causes include: Kidney stones. Pregnancy. A tumor or cyst in the abdomen or pelvis. An enlarged prostate gland. Other causes include: Bladder infection. Scar tissue from a previous surgery or injury. A blood clot. Cancer of the prostate, bladder, uterus, ovary, or colon. What are the signs or symptoms? Symptoms of this condition include: Pain or discomfort in your side (flank) or abdomen. Swelling in your abdomen. Nausea and vomiting. Fever. Pain when passing urine. Feelings of urgency when you need to urinate. Urinating more often than normal. In some cases, you may not have any symptoms. How is this diagnosed? This condition may be diagnosed based on: Your symptoms and medical history. A physical exam. Blood and urine tests. Imaging tests, such as an ultrasound, CT scan, or MRI. A procedure to look at your urinary tract and bladder by inserting a scope into the urethra (cystoscopy). How is this treated? Treatment for this condition depends on where the blockage is, how long it has been there, and what caused it. The goal of treatment is to remove the blockage. Treatment may include: Antibiotic medicines to treat or prevent infection. A procedure to place a small, thin tube (stent) into a blocked ureter. The stent will keep the ureter open so that urine can drain through it. A nonsurgical procedure that crushes kidney stones with shock waves  (extracorporeal shock wave lithotripsy). If kidney failure occurs, treatment may include dialysis or a kidney transplant. Follow these instructions at home:  Take over-the-counter and prescription medicines only as told by your health care provider. If you were prescribed an antibiotic medicine, take it exactly as told by your health care provider. Do not stop taking the antibiotic even if you start to feel better. Rest and return to your normal activities as told by your health care provider. Ask your health care provider what activities are safe for you. Drink enough fluid to keep your urine pale yellow. Keep all follow-up visits. This is important. Contact a health care provider if: You continue to have symptoms after treatment. You develop new symptoms. Your urine becomes cloudy or bloody. You have a fever. Get help right away if: You have severe flank or abdominal pain. You cannot drink fluids without vomiting. Summary Hydronephrosis is the swelling of one or both kidneys due to a blockage that stops urine from flowing out of the body. Hydronephrosis can lead to kidney failure and may become life-threatening if not treated promptly. The goal of treatment is to remove the blockage. It may include a procedure to insert a stent into a blocked ureter, a procedure to break up kidney stones, or taking antibiotic medicines. Follow your health care provider's instructions for taking care of yourself at home, including instructions about drinking fluids, taking medicines, and limiting activities. This information is not intended to replace advice given to you by your health care provider. Make sure you discuss any questions you have with your health care provider. Document Revised: 03/09/2023 Document Reviewed: 03/09/2023 Elsevier  Patient Education  2024 [employer].

## 2024-07-14 ENCOUNTER — Telehealth: Payer: Self-pay

## 2024-07-14 NOTE — Telephone Encounter (Signed)
 Called pt and made aware once Md, McKenzie review CT Scan, someone will reach out with results. Voiced understanding

## 2024-07-19 ENCOUNTER — Telehealth: Payer: Self-pay

## 2024-07-19 DIAGNOSIS — Q6211 Congenital occlusion of ureteropelvic junction: Secondary | ICD-10-CM

## 2024-07-19 NOTE — Telephone Encounter (Signed)
-----   Message from Belvie Clara, MD sent at 07/18/2024 10:01 AM EST ----- She is stable bilateral UPJ obstructions. I will see her back in 3 months with a lasix  renogram ----- Message ----- From: Gretta Carlos SAUNDERS, CMA Sent: 07/14/2024  12:10 PM EST To: Belvie LITTIE Clara, MD  Please review. Pt want results

## 2024-07-19 NOTE — Telephone Encounter (Signed)
 Called pt to make her aware that her Lasix  renogram was scheduled. Pt is advised to stop taking her lasix  medication the day of the procedure and to drink lot of fluid the day of the procedure. Voiced understanding

## 2024-07-19 NOTE — Telephone Encounter (Signed)
Pt was made aware and voiced understanding

## 2024-07-19 NOTE — Addendum Note (Signed)
 Addended by: GRETTA MASTERS R on: 07/19/2024 12:16 PM   Modules accepted: Orders

## 2024-07-27 ENCOUNTER — Encounter (HOSPITAL_COMMUNITY): Admission: RE | Admit: 2024-07-27 | Source: Ambulatory Visit

## 2024-07-31 ENCOUNTER — Encounter (HOSPITAL_COMMUNITY)
